# Patient Record
Sex: Female | Born: 1970 | Race: Asian | Hispanic: No | Marital: Married | Smoking: Never smoker
Health system: Southern US, Community
[De-identification: ages and names within clinical notes are randomized; demographics above are authoritative.]

---

## 2010-02-01 ENCOUNTER — Encounter: Admission: RE | Admit: 2010-02-01 | Discharge: 2010-02-01 | Payer: Self-pay | Admitting: Family Medicine

## 2010-02-01 ENCOUNTER — Encounter (INDEPENDENT_AMBULATORY_CARE_PROVIDER_SITE_OTHER): Payer: Self-pay | Admitting: *Deleted

## 2010-03-06 ENCOUNTER — Ambulatory Visit: Payer: Self-pay | Admitting: Gastroenterology

## 2010-03-06 ENCOUNTER — Encounter (INDEPENDENT_AMBULATORY_CARE_PROVIDER_SITE_OTHER): Payer: Self-pay | Admitting: *Deleted

## 2010-03-06 ENCOUNTER — Ambulatory Visit (HOSPITAL_COMMUNITY): Admission: RE | Admit: 2010-03-06 | Discharge: 2010-03-06 | Payer: Self-pay | Admitting: Gastroenterology

## 2010-03-06 DIAGNOSIS — R1013 Epigastric pain: Secondary | ICD-10-CM

## 2010-03-06 DIAGNOSIS — Z862 Personal history of diseases of the blood and blood-forming organs and certain disorders involving the immune mechanism: Secondary | ICD-10-CM

## 2010-03-06 LAB — CONVERTED CEMR LAB
ALT: 25 units/L (ref 0–35)
AST: 30 units/L (ref 0–37)
Albumin: 4.2 g/dL (ref 3.5–5.2)
Alkaline Phosphatase: 42 units/L (ref 39–117)
BUN: 4 mg/dL — ABNORMAL LOW (ref 6–23)
Basophils Absolute: 0.1 10*3/uL (ref 0.0–0.1)
CO2: 29 meq/L (ref 19–32)
Eosinophils Absolute: 0.2 10*3/uL (ref 0.0–0.7)
Folate: >20 ng/mL
GFR calc non Af Amer: 98.57 mL/min (ref >60)
Hemoglobin: 11.1 g/dL — ABNORMAL LOW (ref 12.0–15.0)
Lymphs Abs: 1.4 10*3/uL (ref 0.7–4.0)
MCV: 94 fL (ref 78.0–100.0)
Monocytes Absolute: 0.4 10*3/uL (ref 0.1–1.0)
Monocytes Relative: 6 % (ref 3.0–12.0)
Neutro Abs: 4.3 10*3/uL (ref 1.4–7.7)
Neutrophils Relative %: 68 % (ref 43.0–77.0)
RBC: 3.47 M/uL — ABNORMAL LOW (ref 3.87–5.11)
Sodium: 139 meq/L (ref 135–145)
TSH: 2.01 microintl units/mL (ref 0.35–5.50)
Total Bilirubin: 0.8 mg/dL (ref 0.3–1.2)
Transferrin: 241 mg/dL (ref 212.0–360.0)

## 2010-03-07 ENCOUNTER — Ambulatory Visit: Payer: Self-pay | Admitting: Gastroenterology

## 2010-03-08 ENCOUNTER — Telehealth: Payer: Self-pay | Admitting: Gastroenterology

## 2010-03-09 ENCOUNTER — Telehealth: Payer: Self-pay | Admitting: Gastroenterology

## 2010-03-12 ENCOUNTER — Encounter: Payer: Self-pay | Admitting: Gastroenterology

## 2010-03-12 ENCOUNTER — Telehealth: Payer: Self-pay | Admitting: Gastroenterology

## 2010-03-14 ENCOUNTER — Encounter: Payer: Self-pay | Admitting: Gastroenterology

## 2010-03-19 ENCOUNTER — Telehealth: Payer: Self-pay | Admitting: Gastroenterology

## 2010-04-03 ENCOUNTER — Ambulatory Visit: Payer: Self-pay | Admitting: Gastroenterology

## 2010-04-03 DIAGNOSIS — G43701 Chronic migraine without aura, not intractable, with status migrainosus: Secondary | ICD-10-CM | POA: Insufficient documentation

## 2010-07-19 ENCOUNTER — Encounter: Admission: RE | Admit: 2010-07-19 | Discharge: 2010-07-19 | Payer: Self-pay | Admitting: Specialist

## 2010-10-04 IMAGING — US US ABDOMEN COMPLETE
1 series · 14 of 25 positions shown · non-contrast
Comparison: None.

CLINICAL DATA: Epigastric abdominal pain.

ABDOMINAL ULTRASOUND COMPLETE

[Series 1: us abdomen complete · 0.15mm/px · 14 of 83 slices shown]
[im 1/83]
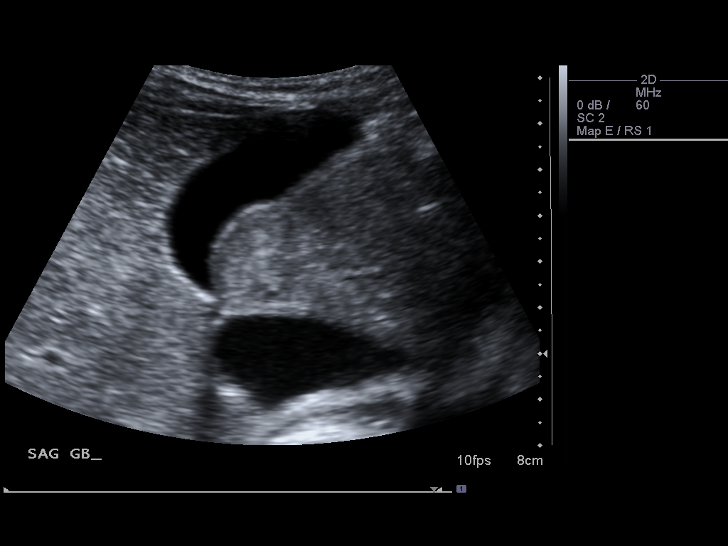
[im 7/83]
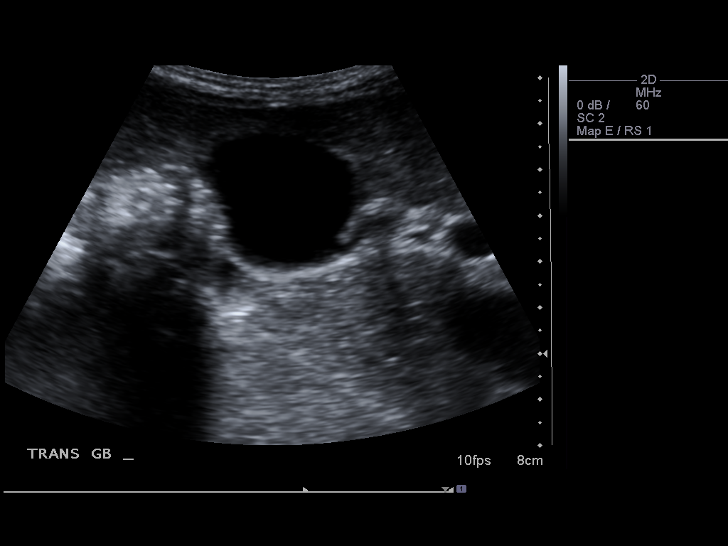
[im 14/83]
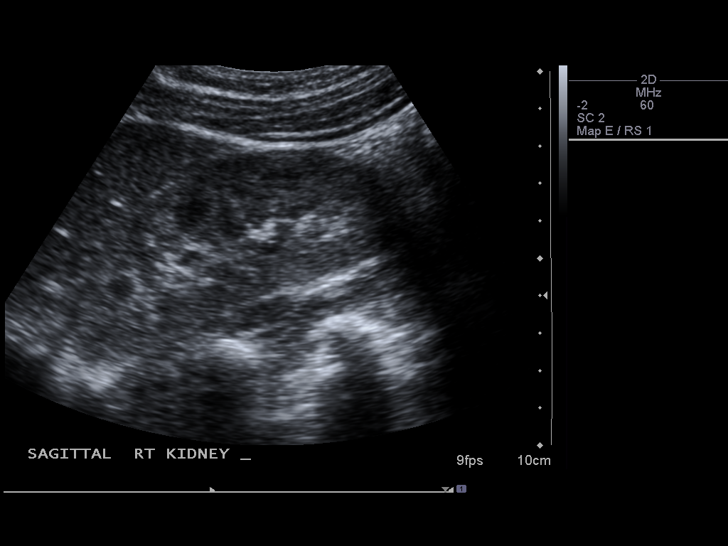
[im 21/83]
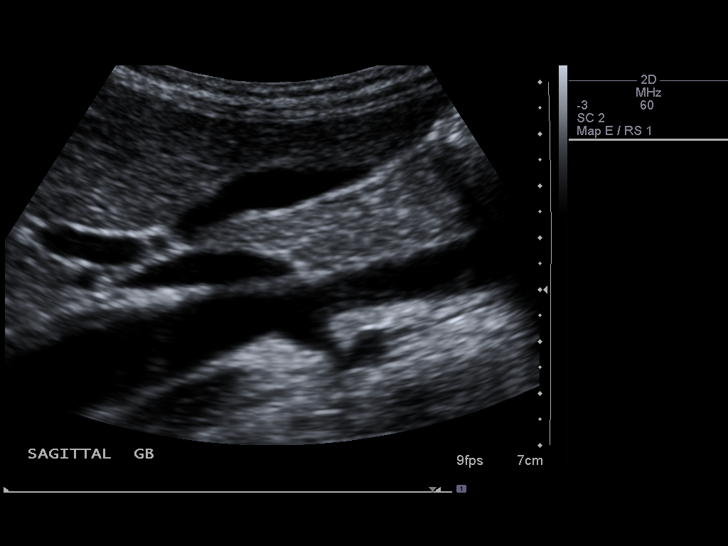
[im 28/83]
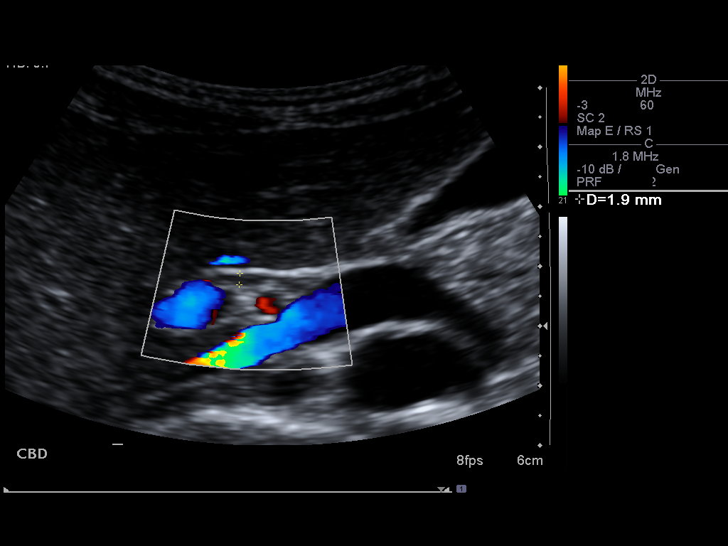
[im 31/83]
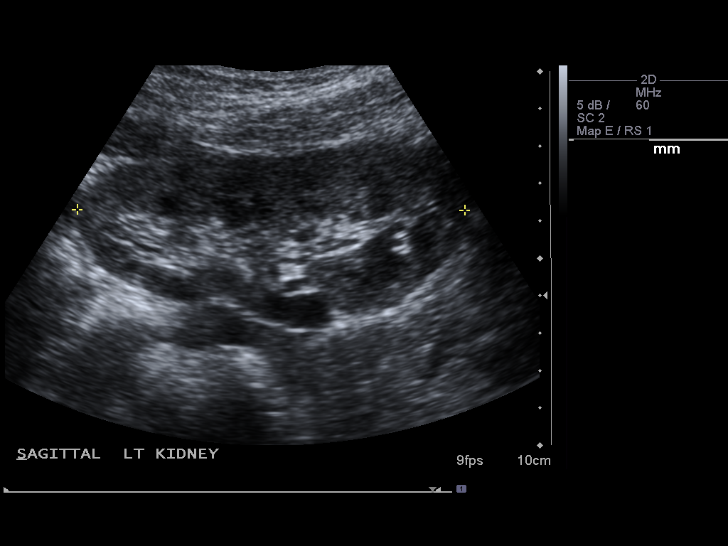
[im 38/83]
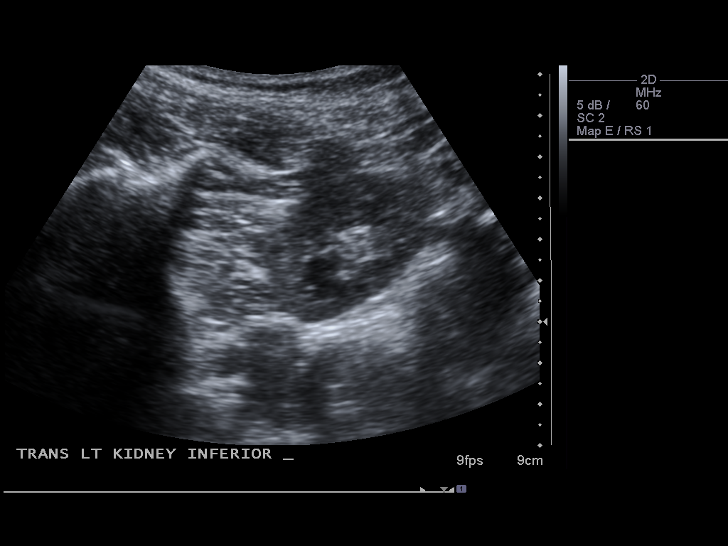
[im 45/83]
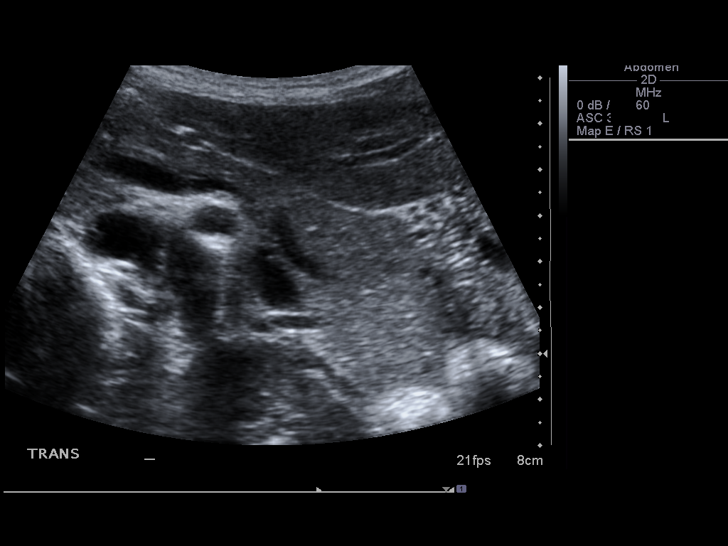
[im 52/83]
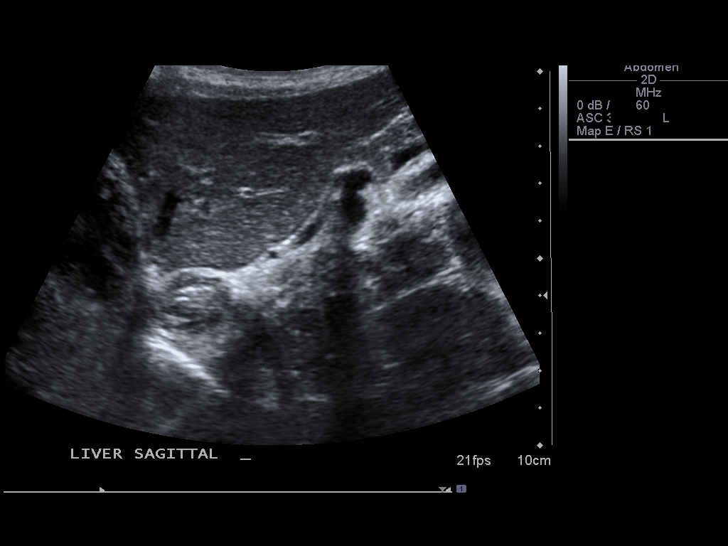
[im 55/83]
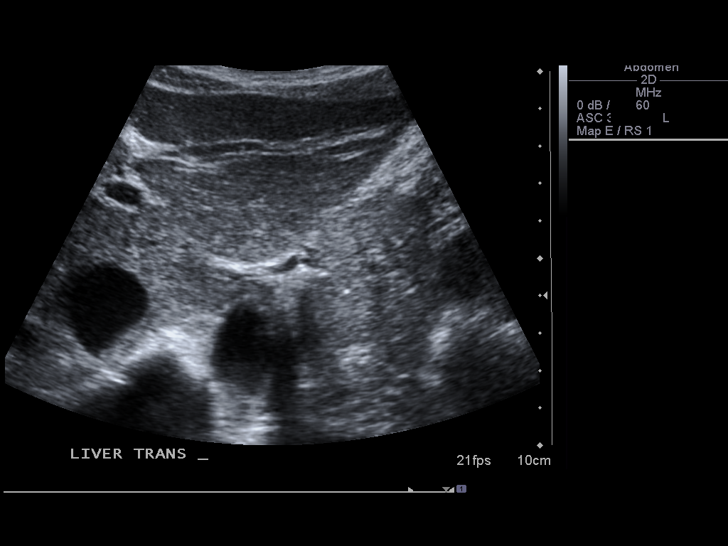
[im 62/83]
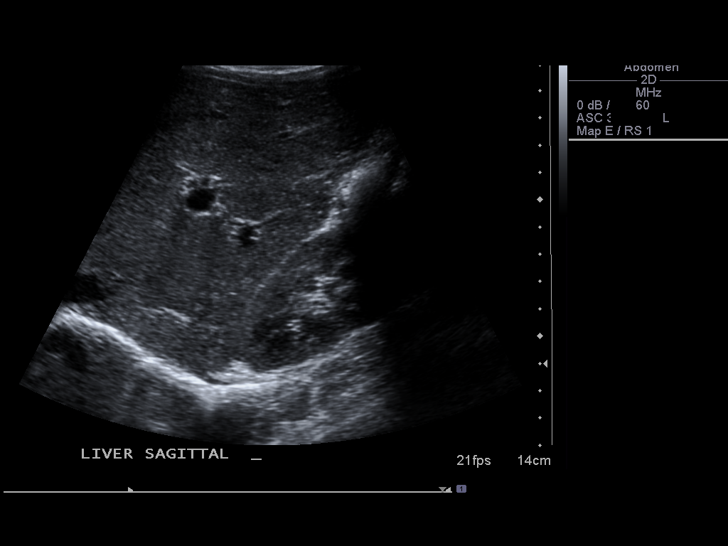
[im 69/83]
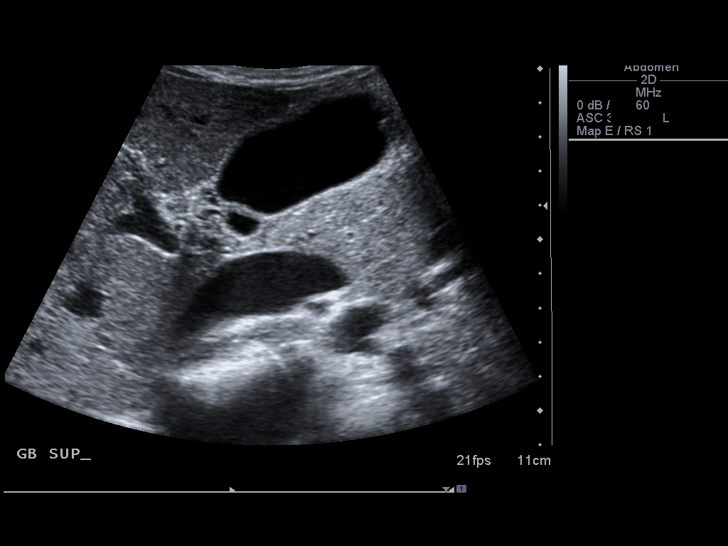
[im 76/83]
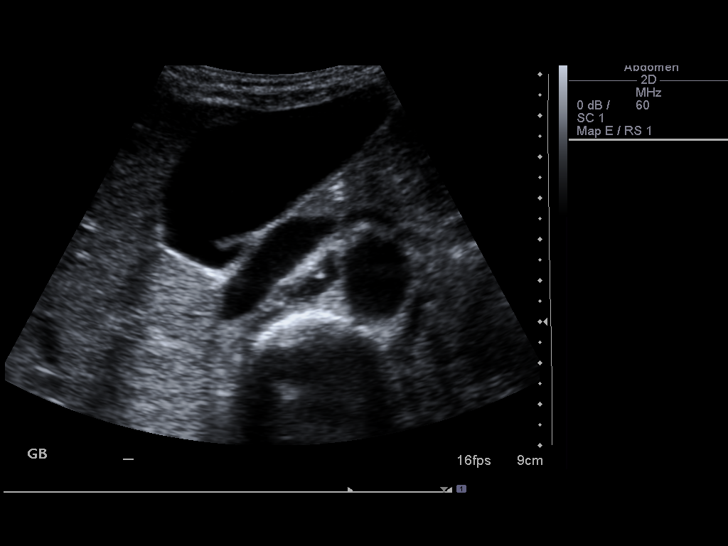
[im 83/83]
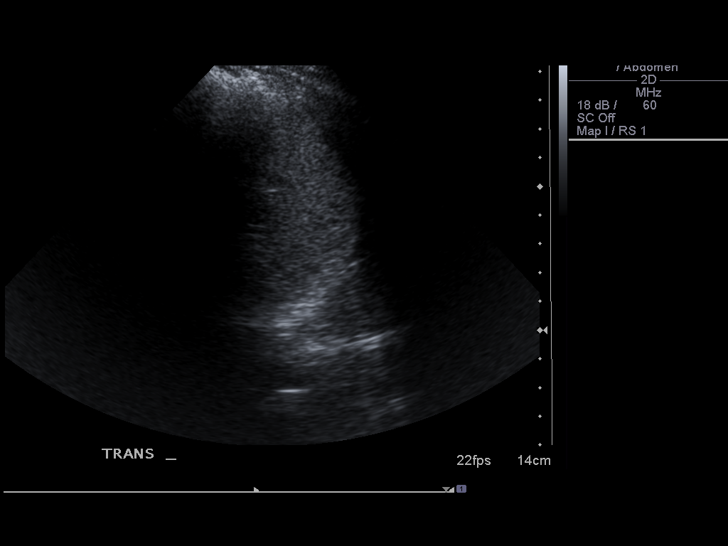

[14 of 25 positions shown; findings below may reference images not displayed]

FINDINGS: Gallbladder:  No gallstones, gallbladder wall thickening, or
pericholecystic fluid.

Common Bile Duct:  Within normal limits in caliber. Measures 4 mm
in diameter.

Liver:  No focal mass lesion identified.  Within normal limits in
parenchymal echogenicity.

IVC:  Appears normal.

Pancreas:  No abnormality identified, although entire pancreas
cannot be visualized by ultrasound.

Spleen:  Within normal limits in size and echotexture.

Right kidney:  Normal in size and parenchymal echogenicity.  No
evidence of mass or hydronephrosis.

Left kidney:  Normal in size and parenchymal echogenicity.  No
evidence of mass or hydronephrosis.

Abdominal Aorta:  No aneurysm identified.
IMPRESSION: Negative abdominal ultrasound.

## 2010-12-11 NOTE — Progress Notes (Signed)
Summary: Medication  Phone Note Call from Patient Call back at Home Phone [phone] Call back at [phone]   Caller: Patient Call For: Dr. Jarold Motto Summary of Call: Ins. will not cover Aciphex...needs an alternative. [facility] Initial call taken by: Karna Christmas,  Mar 19, 2010 12:02 PM  Follow-up for Phone Call        Pt was offered samples of aciphex to last until OV but pt states the aciphex caused constipation and she wants to try another med.  Would like to try prilosec because it has the best insurance coverage.   Ok to change?  20 or 40 mg? Follow-up by: Ashok Cordia RN,  Mar 19, 2010 12:30 PM  Additional Follow-up for Phone Call Additional follow up Details #1::        20 MG IS OK Additional Follow-up by: Mardella Layman MD [facility],  Mar 19, 2010 2:43 PM    New/Updated Medications: PRILOSEC 20 MG  CPDR (OMEPRAZOLE) 1 each day 30 minutes before meal Prescriptions: PRILOSEC 20 MG  CPDR (OMEPRAZOLE) 1 each day 30 minutes before meal  #30 x 6   Entered by:   Ashok Cordia RN   Authorized by:   Mardella Layman MD [facility]   Signed by:   Ashok Cordia RN on 03/19/2010   Method used:   Electronically to        [facility]  [phone]* (retail)       [address]       [place], [place]  [address]       Ph: [phone] or [phone]       Fax: [phone]   RxID:   [id number]

## 2010-12-11 NOTE — Assessment & Plan Note (Signed)
Summary: Post proc, 1 mo/dfs   History of Present Illness Visit Type: Follow-up Visit Primary GI MD: Sheryn Bison MD FACP FAGA Primary Provider: Marye Round, MD  Requesting Provider: na Chief Complaint: F/u from endo. Pt states that she feels great and denies any GI complaints History of Present Illness:   This patient is currently on Prilosec 20 mg a day for NSAID-induced gastric ulcerations confirmed at the time of previous endoscopy. Evaluation for Helicobacter were negative. Also evaluation for celiac disease was negative.  She was using frequent NSAIDs because of migraine headaches, these were discontinued, and she has been given a prescription for p.r.n. tramadol. She has not had neurologic evaluation, but denies other neurological symptoms at this time.   GI Review of Systems      Denies abdominal pain, acid reflux, belching, bloating, chest pain, dysphagia with liquids, dysphagia with solids, heartburn, loss of appetite, nausea, vomiting, vomiting blood, weight loss, and  weight gain.        Denies anal fissure, black tarry stools, change in bowel habit, constipation, diarrhea, diverticulosis, fecal incontinence, heme positive stool, hemorrhoids, irritable bowel syndrome, jaundice, light color stool, liver problems, rectal bleeding, and  rectal pain.    Current Medications (verified): 1)  Yasmin 28 3-0.03 Mg Tabs (Drospirenone-Ethinyl Estradiol) .... Daily 2)  Miralax  Powd (Polyethylene Glycol 3350) .... As Needed 3)  Prilosec 20 Mg  Cpdr (Omeprazole) .Marland Kitchen.. 1 Each Day 30 Minutes Before Meal 4)  Tramadol Hcl 50 Mg Tabs (Tramadol Hcl) .... Take 1 Tablet Every 6-8 Hrs As Needed For Pain  Allergies (verified): 1)  ! * Peanuts  Past History:  Past medical, surgical, family and social histories (including risk factors) reviewed for relevance to current acute and chronic problems.  Past Medical History: ANEMIA, MILD, HX OF (ICD-V12.3) during pregnancy  Past Surgical  History: Reviewed history from 03/06/2010 and no changes required. C-Section  Family History: Reviewed history from 03/06/2010 and no changes required. Family History of Diabetes:  Family History of Heart Disease:  No FH of Colon Cancer:  Social History: Reviewed history from 03/06/2010 and no changes required. Married 3 children Occupation: homemaker Patient is a former smoker.  Alcohol Use - no Illicit Drug Use - yes  3 per day Illicit Drug Use - no  Review of Systems  The patient denies allergy/sinus, anemia, anxiety-new, arthritis/joint pain, back pain, blood in urine, breast changes/lumps, change in vision, confusion, cough, coughing up blood, depression-new, fainting, fatigue, fever, headaches-new, hearing problems, heart murmur, heart rhythm changes, itching, menstrual pain, muscle pains/cramps, night sweats, nosebleeds, pregnancy symptoms, shortness of breath, skin rash, sleeping problems, sore throat, swelling of feet/legs, swollen lymph glands, thirst - excessive , urination - excessive , urination changes/pain, urine leakage, vision changes, and voice change.    Vital Signs:  Patient profile:   39 year old female Height:      63.5 inches Weight:      107 pounds BMI:     18.72 BSA:     1.49 Pulse rate:   68 / minute Pulse rhythm:   regular BP sitting:   100 / 64  (left arm) Cuff size:   regular  Vitals Entered By: Ok Anis CMA (Apr 03, 2010 9:47 AM)  Physical Exam  General:  Well developed, well nourished, no acute distress.healthy appearing.   Psych:  Alert and cooperative. Normal mood and affect.   Impression & Recommendations:  Problem # 1:  EPIGASTRIC PAIN (ICD-789.06) Assessment Improved Continue Prilosec daily for  another month and may discontinue if asymptomatic. She been advised to continue to avoid NSAIDs. Appointment has been made for her with Dr. Meryl Crutch at the headache clinic.  Problem # 2:  CHRONIC MIGRAINE W/O AURA W/O INTRACTABLE W/SM  (ICD-346.72)  Patient Instructions: 1)  Continue Prilosec for one month then you may stop it. 2)  You are being referred to a headache clinic. 3)  The medication list was reviewed and reconciled.  All changed / newly prescribed medications were explained.  A complete medication list was provided to the patient / caregiver. 4)  Copy sent to : Dr. Rudene Christians [facility] the [facility] and Dr. Aleatha Borer primary care. 5)  Avoid all NSAIDs and salicylates with p.r.n. tramadol use pending neurological evaluation and protocol for chronic headaches.  Appended Document: Post proc, 1 mo/dfs [facility].  Pt notified of appt.  04/16/10 at 8:00.  Dr. Catalina Lunger   Clinical Lists Changes  Orders: Added new Referral order of Headache Clinic Referral (Headache) - Signed

## 2010-12-11 NOTE — Miscellaneous (Signed)
Summary: Aciphex Samples   Clinical Lists Changes  Medications: Removed medication of ACIPHEX 20 MG  TBEC (RABEPRAZOLE SODIUM) Take 1 twice a day 30 minutes before meals Changed medication from ACIPHEX 20 MG TBEC (RABEPRAZOLE SODIUM) take 1 tablet daily 30 min before breakfast to ACIPHEX 20 MG TBEC (RABEPRAZOLE SODIUM) take 1 tablet daily 30 min before breakfast

## 2010-12-11 NOTE — Letter (Signed)
Summary: EGD Instructions  [facility]  [address] [place], [place] [address]   Phone: [phone]  Fax: [phone]       TYISHIA AUNE    [birth year]    MRN: [id number]       Procedure Day Dorna Bloom: Wednesday, 03/07/10     Arrival Time: 10:30     Procedure Time: 11:30     Location of Procedure:                    _X  _ [facility] (4th Floor)     PREPARATION FOR ENDOSCOPY   On 03/07/10 THE DAY OF THE PROCEDURE:  1.   No solid foods, milk or milk products are allowed after midnight the night before your procedure.  2.   Do not drink anything colored red or purple.  Avoid juices with pulp.  No orange juice.  3.  You may drink clear liquids until 9:30 , which is 2 hours before your procedure.                                                                                                CLEAR LIQUIDS INCLUDE: Water Jello Ice Popsicles Tea (sugar ok, no milk/cream) Powdered fruit flavored drinks Coffee (sugar ok, no milk/cream) Gatorade Juice: apple, white grape, white cranberry  Lemonade Clear bullion, consomm, broth Carbonated beverages (any kind) Strained chicken noodle soup Hard Candy   MEDICATION INSTRUCTIONS  Unless otherwise instructed, you should take regular prescription medications with a small sip of water as early as possible the morning of your procedure.  Diabetic patients - see separate instructions.                    OTHER INSTRUCTIONS  You will need a responsible adult at least 41 years of age to accompany you and drive you home.   This person must remain in the waiting room during your procedure.  Wear loose fitting clothing that is easily removed.  Leave jewelry and other valuables at home.  However, you may wish to bring a book to read or an iPod/MP3 player to listen to music as you wait for your procedure to start.  Remove all body piercing jewelry and leave at home.  Total time from sign-in  until discharge is approximately 2-3 hours.  You should go home directly after your procedure and rest.  You can resume normal activities the day after your procedure.  The day of your procedure you should not:   Drive   Make legal decisions   Operate machinery   Drink alcohol   Return to work  You will receive specific instructions about eating, activities and medications before you leave.    The above instructions have been reviewed and explained to me by   _______________________    I fully understand and can verbalize these instructions _____________________________ Date _________

## 2010-12-11 NOTE — Progress Notes (Signed)
  Phone Note Outgoing Call   Call placed by: Ok Anis CMA,  Mar 12, 2010 9:03 AM Call placed to: Insurer Summary of Call: Sent prior authorization form to [employer] [place]....Waiting on approval  Initial call taken by: Ok Anis CMA,  Mar 12, 2010 9:05 AM     Appended Document:  I called patient to explain that the insurance company [employer] will not approve Aciphex until she tries Prilosec, Protonix, Prevacid, or Nexium.  I advised patient that she has an office visit with Dr. Jarold Motto on May 24 at 9:45 am and per Lupita Leash S.-RN to keep this appointment and talk to Dr. Jarold Motto about the PPI because of her recent diagnosis. I will put more Aciphex samples up front to last patient until her appointment to talk to Dr. Jarold Motto.

## 2010-12-11 NOTE — Miscellaneous (Signed)
Summary: clotest  Clinical Lists Changes  Orders: Added new Test order of TLB-H Pylori Screen Gastric Biopsy (83013-CLOTEST) - Signed 

## 2010-12-11 NOTE — Procedures (Signed)
Summary: Upper Endoscopy  Patient: Meghan Matthews Note: All result statuses are Final unless otherwise noted.  Tests: (1) Upper Endoscopy (EGD)   EGD Upper Endoscopy       DONE     [facility]     [address]     [place], [place]  [address]           ENDOSCOPY PROCEDURE REPORT           PATIENT:  Meghan, Matthews  MR#:  [id number]     BIRTHDATE:  01/11/1971, 38 yrs. old  GENDER:  female           ENDOSCOPIST:  Vania Rea. Jarold Motto, MD, [facility]     Referred by:           PROCEDURE DATE:  03/07/2010     PROCEDURE:  EGD with biopsy     ASA CLASS:  Class I     INDICATIONS:  abdominal pain           MEDICATIONS:   Fentanyl 50 mcg IV, Versed 5 mg IV     TOPICAL ANESTHETIC:  Exactacain Spray           DESCRIPTION OF PROCEDURE:   After the risks benefits and     alternatives of the procedure were thoroughly explained, informed     consent was obtained.  The LB [id number] endoscope was     introduced through the mouth and advanced to the second portion of     the duodenum, without limitations.  The instrument was slowly     withdrawn as the mucosa was fully examined.     <<PROCEDUREIMAGES>>           Multiple ulcers were found in the antrum. 3MM-8MM,,,ULCERS.SHALLOW     WITH MILD GASTRITIS AND DUODENITIS.CLO AND REGULAR BIOPSIES DONE.     Normal duodenal folds were noted. SI BX. DONE  The esophagus and     gastroesophageal junction were completely normal in appearance.     "INLET POUCH" OF HETEROTROPHIC GASTRIC MUCOSA IN PROXIMAL     ESOPHAGUS NOTED.    Retroflexed views revealed no abnormalities.     The scope was then withdrawn from the patient and the procedure     completed.           COMPLICATIONS:  None           ENDOSCOPIC IMPRESSION:     1) Ulcers, multiple in the antrum     2) Normal duodenal folds     3) Normal esophagus     1.PROBABLE NSAID DAMAGE AND ULCERATIONS.R/O H.PYLORI.     2.R/O CELIAC DISEASE     RECOMMENDATIONS:     1) Await biopsy  results     2) Avoid NSAIDS for two weeks     1.ACIPHEX 20 MG /DAILY.330.SEE ME 1 MONTH.     2.CONTINUE PRN MIRALAX     3PRN TRAMADOL 50MG . Q6-8 PRN FOR PAIN.#65,REFILL X 2.           REPEAT EXAM:  No           ______________________________     Vania Rea. Jarold Motto, MD, [facility]           CC:           n.     eSIGNED:   Vania Rea. Preston Garabedian at 03/07/2010 12:08 PM           [employer], [phone]  Note: An exclamation mark Marland Kitchen)  indicates a result that was not dispersed into the flowsheet. Document Creation Date: 03/07/2010 12:09 PM _______________________________________________________________________  (1) Order result status: Final Collection or observation date-time: 03/07/2010 11:57 Requested date-time:  Receipt date-time:  Reported date-time:  Referring Physician:   Ordering Physician: Sheryn Bison [phone]) Specimen Source:  Source: Launa Grill Order Number: [id number] Lab site:

## 2010-12-11 NOTE — Miscellaneous (Signed)
Summary: Aciphex/Tramadol Rx  Clinical Lists Changes  Medications: Added new medication of ACIPHEX 20 MG TBEC (RABEPRAZOLE SODIUM) take 1 tablet daily 30 min before breakfast - Signed Added new medication of TRAMADOL HCL 50 MG TABS (TRAMADOL HCL) Take 1 tablet every 6-8 hrs as needed for pain - Signed Rx of ACIPHEX 20 MG TBEC (RABEPRAZOLE SODIUM) take 1 tablet daily 30 min before breakfast;  #30 x 0;  Signed;  Entered by: Jennye Boroughs RN;  Authorized by: Mardella Layman MD [facility];  Method used: Electronically to [facility]  [phone]*, [address], [place], [place], [place]  [address], Ph: [phone] or [phone], Fax: [phone] Rx of TRAMADOL HCL 50 MG TABS (TRAMADOL HCL) Take 1 tablet every 6-8 hrs as needed for pain;  #65 x 2;  Signed;  Entered by: Jennye Boroughs RN;  Authorized by: Mardella Layman MD [facility];  Method used: Electronically to [facility]  [phone]*, [address], [place], [place], [place]  [address], Ph: [phone] or [phone], Fax: [phone]    Prescriptions: TRAMADOL HCL 50 MG TABS (TRAMADOL HCL) Take 1 tablet every 6-8 hrs as needed for pain  #65 x 2   Entered by:   Jennye Boroughs RN   Authorized by:   Mardella Layman MD [facility]   Signed by:   Jennye Boroughs RN on 03/07/2010   Method used:   Electronically to        [facility]  [phone]* (retail)       [address]       [place], [place]  [address]       Ph: [phone] or [phone]       Fax: [phone]   RxID:   [id number] ACIPHEX 20 MG TBEC (RABEPRAZOLE SODIUM) take 1 tablet daily 30 min before breakfast  #30 x 0   Entered by:   Jennye Boroughs RN   Authorized by:   Mardella Layman MD [facility]   Signed by:   Jennye Boroughs RN on 03/07/2010   Method used:   Electronically to        [facility]  [phone]* (retail)       [address]       [place], [place]   [address]       Ph: [phone] or [phone]       Fax: [phone]   RxID:   [id number]

## 2010-12-11 NOTE — Letter (Signed)
Summary: New Patient letter  [facility]  [address] [place], [place] [address]   Phone: [phone]  Fax: [phone]       02/01/2010 MRN: [id number]  [facility] [address] [place], [place]  [address]  Dear Ms. Marquis Lunch,  Welcome to the Gastroenterology Division at [facility].    You are scheduled to see Dr. Jarold Motto on 03/06/2010 at 8:30AM on the 3rd floor at [facility], [address]  We ask that you try to arrive at our office 15 minutes prior to your appointment time to allow for check-in.  We would like you to complete the enclosed self-administered evaluation form prior to your visit and bring it with you on the day of your appointment.  We will review it with you.  Also, please bring a complete list of all your medications or, if you prefer, bring the medication bottles and we will list them.  Please bring your insurance card so that we may make a copy of it.  If your insurance requires a referral to see a specialist, please bring your referral form from your primary care physician.  Co-payments are due at the time of your visit and may be paid by cash, check or credit card.     Your office visit will consist of a consult with your physician (includes a physical exam), any laboratory testing he/she may order, scheduling of any necessary diagnostic testing (e.g. x-ray, ultrasound, CT-scan), and scheduling of a procedure (e.g. Endoscopy, Colonoscopy) if required.  Please allow enough time on your schedule to allow for any/all of these possibilities.    If you cannot keep your appointment, please call [phone] to cancel or reschedule prior to your appointment date.  This allows [place] the opportunity to schedule an appointment for another patient in need of care.  If you do not cancel or reschedule by 5 p.m. the business day prior to your appointment date, you will be charged a $50.00 late cancellation/no-show fee.    Thank you for  choosing [facility] for your medical needs.  We appreciate the opportunity to care for you.  Please visit [place] at our website  to learn more about our practice.                     Sincerely,                                                             The Gastroenterology Division

## 2010-12-11 NOTE — Progress Notes (Signed)
Summary: Aciphex Prior-Authorization   Phone Note Outgoing Call   Call placed by: Ok Anis CMA,  March 08, 2010 12:16 PM Call placed to: Insurer Summary of Call: [employer] and spoke with [place]. Per [place] will fax over form to  start prior authorization for patients Aciphex. I filled out and faxed back to [employer] waiting on response. Initial call taken by: Ok Anis CMA,  March 08, 2010 12:18 PM

## 2010-12-11 NOTE — Assessment & Plan Note (Signed)
Summary: ESPIGASTRIC PAIN,CHORNIC CONSTIPATION...AS.   History of Present Illness Visit Type: Initial Consult Primary GI MD: Sheryn Bison MD FACP FAGA Primary Provider: Jeanett Schlein Requesting Provider: Jeanett Schlein Chief Complaint: Chronic constipation and Epigastric pain History of Present Illness:   40 year old [place] female referred by Dr. Kathrynn Running for evaluation of worsening constipation, abdominal gas and bloating, and epigastric abdominal pain over the last several months.  This patient been in excellent health without chronic medical problems except for the last several months that she's had epigastric discomfort responding to frequent small meals. She is awakening at night with abdominal pain relieved by Maalox. Because of her pain she's had diminished appetite, diminished p.o. intake, and associated constipation with gas and bloating despite taking p.r.n. MiraLax and probiotics. She is a strict VEGAN and uses no meet or dairy products. She denies rectal bleeding or melena. She has not had previous GI evaluation or barium studies. She does take occasional NSAIDs but denies NSAID abuse, use of alcohol or cigarettes.  She was recently seen at [facility] and has a mild hyponatremia but otherwise a normal CBC a metabolic profile. Thyroid function tests were normal. She's had no systemic complaints such as fever, chills, skin rashes, or arthritic problems. She denies specific food intolerances such as lactose, and does not use p.o. sorbitol or fructose. Family history noncontributory. She continued with severe abdominal gas and bloating. She is on daily birth control pills. Recent pregnancy test was negative.   GI Review of Systems    Reports abdominal pain, acid reflux, belching, chest pain, heartburn, loss of appetite, nausea, and  vomiting.      Denies bloating, dysphagia with liquids, dysphagia with solids, vomiting blood, weight loss, and  weight gain.     Reports change in bowel habits.     Denies anal fissure, black tarry stools, constipation, diarrhea, diverticulosis, fecal incontinence, heme positive stool, hemorrhoids, irritable bowel syndrome, jaundice, light color stool, liver problems, rectal bleeding, and  rectal pain. Preventive Screening-Counseling & Management  Alcohol-Tobacco     Smoking Status: quit      Drug Use:  no.      Current Medications (verified): 1)  Yasmin 28 3-0.03 Mg Tabs (Drospirenone-Ethinyl Estradiol) .... Daily 2)  Miralax  Powd (Polyethylene Glycol 3350) .... As Needed  Allergies (verified): 1)  ! * Peanuts  Past History:  Past medical, surgical, family and social histories (including risk factors) reviewed for relevance to current acute and chronic problems.  Past Medical History:  ANEMIA, MILD, HX OF (ICD-V12.3) during pregnancy  Past Surgical History: C-Section  Family History: Reviewed history and no changes required. Family History of Diabetes:  Family History of Heart Disease:   Social History: Reviewed history and no changes required. Married 3 children Occupation: homemaker Patient is a former smoker.  Alcohol Use - no Illicit Drug Use - yes  3 per day Illicit Drug Use - no Smoking Status:  quit Drug Use:  no  Review of Systems       The patient complains of allergy/sinus, back pain, cough, depression-new, fatigue, headaches-new, itching, menstrual pain, muscle pains/cramps, night sweats, nosebleeds, shortness of breath, skin rash, sleeping problems, sore throat, thirst - excessive, and urination - excessive.  The patient denies anemia, anxiety-new, arthritis/joint pain, blood in urine, breast changes/lumps, change in vision, confusion, coughing up blood, fainting, fever, hearing problems, heart murmur, heart rhythm changes, pregnancy symptoms, swelling of feet/legs, swollen lymph glands, thirst - excessive , urination - excessive , urination changes/pain,  urine leakage, vision  changes, and voice change.    Vital Signs:  Patient profile:   40 year old female Height:      63.5 inches Weight:      113 pounds BMI:     19.77 BSA:     1.53 Pulse rate:   72 / minute Pulse rhythm:   regular BP sitting:   92 / 60  (left arm)  Vitals Entered By: Merri Ray CMA Duncan Dull) (March 06, 2010 8:14 AM)  Physical Exam  General:  Well developed, well nourished, no acute distress.healthy appearing.   Head:  Normocephalic and atraumatic. Eyes:  PERRLA, no icterus.exam deferred to patient's ophthalmologist.   Mouth:  No deformity or lesions, dentition normal. Neck:  Supple; no masses or thyromegaly. Lungs:  Clear throughout to auscultation. Heart:  Regular rate and rhythm; no murmurs, rubs,  or bruits. Abdomen:  Slight abdominal distention with multiple palpable bowel loops by doughy consistency consistent with feces filled bowel loops. No definite masses or tenderness noted. Rectal:  Normal exam.There is no evidence of perirectal lesions, masses, tenderness, impaction, and stool is questionably guaiac positive. Msk:  Symmetrical with no gross deformities. Normal posture. Pulses:  Normal pulses noted. Extremities:  No clubbing, cyanosis, edema or deformities noted. Neurologic:  Alert and  oriented x4;  grossly normal neurologically. Skin:  Intact without significant lesions or rashes. Cervical Nodes:  No significant cervical adenopathy. Psych:  Alert and cooperative. Normal mood and affect.   Impression & Recommendations:  Problem # 1:  EPIGASTRIC PAIN (ICD-789.06) Assessment Unchanged Her symptomatology seems most consistent with peptic ulcer disease and perhaps H. pylori infection. Have scheduled endoscopy with appropriate biopsies and I placed her on AcipHex 20 mg twice a day. Ultrasound of the upper abdomen is also been ordered as have repeat screening labs. She does not have symptoms suggestive of chronic malabsorption. I suspect her severe constipation related to  her decreased p.o. intake but she may need colonoscopy exam. Orders: TLB-BMP (Basic Metabolic Panel-BMET) (80048-METABOL) TLB-CBC Platelet - w/Differential (85025-CBCD) TLB-Hepatic/Liver Function Pnl (80076-HEPATIC) TLB-TSH (Thyroid Stimulating Hormone) (84443-TSH) TLB-B12, Serum-Total ONLY (16109-U04) TLB-Ferritin (82728-FER) TLB-Folic Acid (Folate) (82746-FOL) TLB-IBC Pnl (Iron/FE;Transferrin) (83550-IBC) TLB-Amylase (82150-AMYL) TLB-H. Pylori Abs(Helicobacter Pylori) (86677-HELICO) TLB-Lipase (83690-LIPASE) TLB-Magnesium (Mg) (83735-MG) TLB-Sedimentation Rate (ESR) (85652-ESR) TLB-IgA (Immunoglobulin A) (82784-IGA) T-Sprue Panel (Celiac Disease Aby Eval) (83516x3/[address]) Ultrasound Abdomen (UAS) EGD (EGD)  Problem # 2:  ANEMIA, MILD, HX OF (ICD-V12.3) Assessment: Unchanged Anemia profile ordered. She is a strict VEGAN and may have associated metabolic abnormalities and electrolyte dysfunction.Celiac antibodies also have been ordered.  Patient Instructions: 1)  Please go to the basement for lab work. 2)  Begin Aciphex twice a day. 3)  You are scheduled for an ultrasound an an upper endoscopy. 4)  The medication list was reviewed and reconciled.  All changed / newly prescribed medications were explained.  A complete medication list was provided to the patient / caregiver. 5)  Copy sent to : Dr. Aleatha Borer 6)  Please continue current medications.  7)  Conscious Sedation brochure given.  8)  Upper Endoscopy brochure given.

## 2010-12-11 NOTE — Letter (Signed)
Summary: Patient [facility] Biopsy Results  [facility]  [address] [place], [place] [address]   Phone: [phone]  Fax: [phone]        Mar 14, 2010 MRN: [id number]    [facility] [address] [place], [place]  [address]    Dear Ms. Cummings,  I am pleased to inform you that the biopsies taken during your recent endoscopic examination did not show any evidence of cancer upon pathologic examination.  Additional information/recommendations:  __No further action is needed at this time.  Please follow-up with      your primary care physician for your other healthcare needs.  __ Please call [phone] to schedule a return visit to review      your condition.  _x_ Continue with the treatment plan as outlined on the day of your      exam.  __ You should have a repeat endoscopic examination for this problem              in _ months/years.   Please call us if you are having persistent problems or have questions about your condition that have not been fully answered at this time.  Sincerely,  Mardella Layman MD [facility]  This letter has been electronically signed by your physician.  Appended Document: Patient Notice-Endo Biopsy Results letter mailed 5.5.11

## 2010-12-11 NOTE — Progress Notes (Signed)
Summary: samples  Phone Note Call from Patient Call back at [phone]   Caller: husband Call For: Dr. Jarold Motto Reason for Call: Refill Medication Summary of Call: pt has run out of Aciphex while waiting for prior auth... would like more samples  Patient insurance is [employer]BS and the form that needs to be filled out for the prior auth is for [facility]  Initial call taken by: Vallarie Mare,  March 09, 2010 8:18 AM  Follow-up for Phone Call        Samples of aciphex left for pt.  Husband informed.  Follow up appt sch.    Follow-up by: Ashok Cordia RN,  March 09, 2010 11:33 AM  Additional Follow-up for Phone Call Additional follow up Details #1::        to [facility] to work on prior aurth Additional Follow-up by: Ashok Cordia RN,  March 09, 2010 11:35 AM    Additional Follow-up for Phone Call Additional follow up Details #2::    I spoke with [employer] on 4-28--note is in patients chart.. Sent prior authorization form on 4-28 waiting for approval. Will call today and check on claim. Follow-up by: Ok Anis CMA,  Mar 12, 2010 8:24 AM  New/Updated Medications: ACIPHEX 20 MG TBEC (RABEPRAZOLE SODIUM) take 1 tablet daily 30 min before breakfast

## 2012-11-08 ENCOUNTER — Ambulatory Visit: Payer: BC Managed Care – PPO

## 2012-11-08 ENCOUNTER — Ambulatory Visit (INDEPENDENT_AMBULATORY_CARE_PROVIDER_SITE_OTHER): Payer: BC Managed Care – PPO | Admitting: Family Medicine

## 2012-11-08 VITALS — BP 148/86 | HR 80 | Temp 98.7°F | Resp 16 | Ht 63.0 in | Wt 110.0 lb

## 2012-11-08 DIAGNOSIS — Z111 Encounter for screening for respiratory tuberculosis: Secondary | ICD-10-CM

## 2012-11-08 DIAGNOSIS — R7611 Nonspecific reaction to tuberculin skin test without active tuberculosis: Secondary | ICD-10-CM

## 2012-11-08 DIAGNOSIS — Z789 Other specified health status: Secondary | ICD-10-CM

## 2012-11-08 DIAGNOSIS — Z9229 Personal history of other drug therapy: Secondary | ICD-10-CM

## 2012-11-08 DIAGNOSIS — Z9289 Personal history of other medical treatment: Secondary | ICD-10-CM

## 2012-11-08 NOTE — Progress Notes (Signed)
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 She came in here for further assessment. She is from [place] and had BCG vaccine in the past. She has had PPDs and they have been positive..  Objective: PPD on left arm is positive 14 mm. Her chest is clear. Heart regular without murmurs.  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  Plan: Check single view chest x-ray  [facility] reading (PRIMARY) by  Dr. Alwyn Ren Normal cxr  Write her a letter justifying her PPD .

## 2012-11-08 NOTE — Progress Notes (Signed)
  Tuberculosis Risk Questionnaire  1. Were you born outside the [place] in one of the following parts of the world:    [place], [place], [place], [place] or [place]?  Yes   2. Have you traveled outside the [place] and lived for more than one month in one of the following parts of the world:  [place], [place], [place], [place] or [place]?  Yes   3. Do you have a compromised immune system such as from any of the following conditions:  HIV/AIDS, organ or bone marrow transplantation, diabetes, immunosuppressive   medicines (e.g. Prednisone, Remicaide), leukemia, lymphoma, cancer of the   head or neck, gastrectomy or jejunal bypass, end-stage renal disease (on   dialysis), or silicosis?  No    4. Have you ever done one of the following:    Used crack cocaine, injected illegal drugs, worked or resided in jail or prison,   worked or resided at a homeless shelter, or worked as a Research scientist (physical sciences) in   direct contact with patients?  No  5. Have you ever been exposed to anyone with infectious tuberculosis?  No   Tuberculosis Symptom Questionnaire  Do you currently have any of the following symptoms?  1. Unexplained cough lasting more than 3 weeks? No  Unexplained fever lasting more than 3 weeks. No   3. Night Sweats (sweating that leaves the bedclothes and sheets wet)   No  4. Shortness of Breath No  5. Chest Pain No  6. Unintentional weight loss  No  7. Unexplained fatigue (very tired for no reason) No

## 2012-11-08 NOTE — Patient Instructions (Signed)
Whenever you need a PPD, Calvin you cannot get it to to the test being positive from your previous BCG vaccination.

## 2013-06-08 IMAGING — CR DG CHEST 1V
1 series · 1 of 1 positions shown · non-contrast
Comparison: 07/19/2010.

CLINICAL DATA: Positive PPD test.

CHEST - 1 VIEW

[PA]
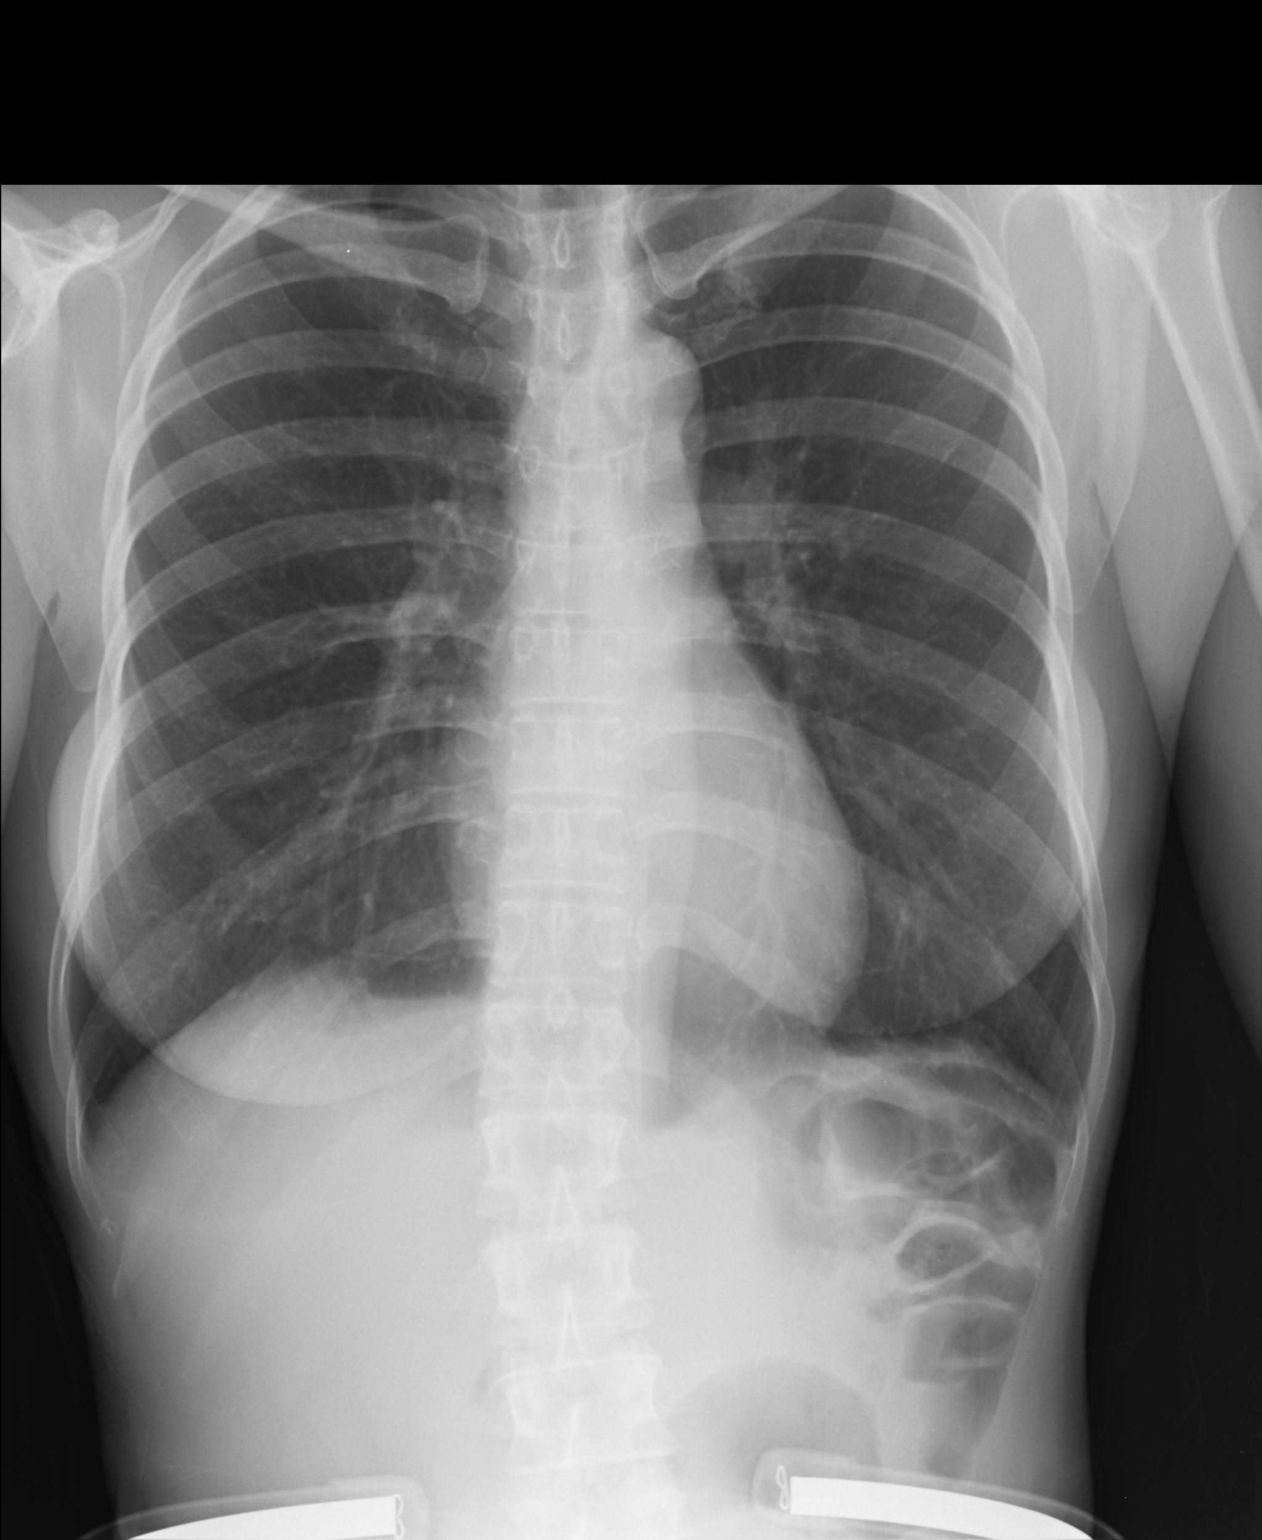

[1 of 1 positions shown; findings below may reference images not displayed]

FINDINGS: Trachea is midline.  Heart size normal.  Lungs are
somewhat hyperinflated but clear.  No pleural fluid.
IMPRESSION: No acute findings.  No evidence of active tuberculosis.

## 2014-02-12 ENCOUNTER — Ambulatory Visit: Payer: BC Managed Care – PPO

## 2014-02-12 ENCOUNTER — Ambulatory Visit (INDEPENDENT_AMBULATORY_CARE_PROVIDER_SITE_OTHER): Payer: BC Managed Care – PPO | Admitting: Physician Assistant

## 2014-02-12 VITALS — BP 102/62 | HR 81 | Temp 98.8°F | Resp 18 | Ht 65.75 in | Wt 110.6 lb

## 2014-02-12 DIAGNOSIS — Z9289 Personal history of other medical treatment: Secondary | ICD-10-CM

## 2014-02-12 DIAGNOSIS — Z0000 Encounter for general adult medical examination without abnormal findings: Secondary | ICD-10-CM

## 2014-02-12 DIAGNOSIS — Z1329 Encounter for screening for other suspected endocrine disorder: Secondary | ICD-10-CM

## 2014-02-12 DIAGNOSIS — Z23 Encounter for immunization: Secondary | ICD-10-CM

## 2014-02-12 DIAGNOSIS — Z13220 Encounter for screening for lipoid disorders: Secondary | ICD-10-CM

## 2014-02-12 DIAGNOSIS — Z789 Other specified health status: Secondary | ICD-10-CM

## 2014-02-12 DIAGNOSIS — Z1239 Encounter for other screening for malignant neoplasm of breast: Secondary | ICD-10-CM

## 2014-02-12 LAB — POCT URINALYSIS DIPSTICK
BILIRUBIN UA: NEGATIVE
GLUCOSE UA: NEGATIVE
Ketones, UA: NEGATIVE
Leukocytes, UA: NEGATIVE
Nitrite, UA: NEGATIVE
PROTEIN UA: NEGATIVE
RBC UA: NEGATIVE
Urobilinogen, UA: 0.2
pH, UA: 6

## 2014-02-12 LAB — COMPLETE METABOLIC PANEL WITH GFR
ALBUMIN: 4.5 g/dL (ref 3.5–5.2)
ALK PHOS: 60 U/L (ref 39–117)
ALT: 12 U/L (ref 0–35)
AST: 19 U/L (ref 0–37)
BUN: 13 mg/dL (ref 6–23)
CO2: 24 mEq/L (ref 19–32)
Calcium: 9.3 mg/dL (ref 8.4–10.5)
Chloride: 102 mEq/L (ref 96–112)
Creat: 0.76 mg/dL (ref 0.50–1.10)
GFR, Est African American: >89 mL/min
GFR, Est Non African American: >89 mL/min
Glucose, Bld: 75 mg/dL (ref 70–99)
Potassium: 3.5 mEq/L (ref 3.5–5.3)
Sodium: 136 mEq/L (ref 135–145)
TOTAL PROTEIN: 7.9 g/dL (ref 6.0–8.3)
Total Bilirubin: 0.5 mg/dL (ref 0.2–1.2)

## 2014-02-12 LAB — POCT CBC
Granulocyte percent: 67.4 %G (ref 37–80)
HEMATOCRIT: 40.4 % (ref 37.7–47.9)
HEMOGLOBIN: 12.8 g/dL (ref 12.2–16.2)
LYMPH, POC: 1.8 (ref 0.6–3.4)
MCH, POC: 30 pg (ref 27–31.2)
MCHC: 31.7 g/dL — AB (ref 31.8–35.4)
MCV: 94.8 fL (ref 80–97)
MID (cbc): 0.4 (ref 0–0.9)
MPV: 8.6 fL (ref 0–99.8)
POC Granulocyte: 4.5 (ref 2–6.9)
POC LYMPH %: 26.2 % (ref 10–50)
POC MID %: 6.4 %M (ref 0–12)
Platelet Count, POC: 225 10*3/uL (ref 142–424)
RBC: 4.26 M/uL (ref 4.04–5.48)
RDW, POC: 13.8 %
WBC: 6.7 10*3/uL (ref 4.6–10.2)

## 2014-02-12 LAB — LIPID PANEL
CHOL/HDL RATIO: 3 ratio
CHOLESTEROL: 139 mg/dL (ref 0–200)
HDL: 46 mg/dL (ref >39)
LDL CALC: 73 mg/dL (ref 0–99)
TRIGLYCERIDES: 98 mg/dL (ref <150)
VLDL: 20 mg/dL (ref 0–40)

## 2014-02-12 LAB — TSH: TSH: 1.136 u[IU]/mL (ref 0.350–4.500)

## 2014-02-12 NOTE — Progress Notes (Signed)
   Subjective:    Patient ID: Meghan Matthews, female    DOB: 11-Jul-1971, 43 y.o.   MRN: [id number]  HPI Here for CPE.  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  She is also waiting on [employer] [facility] [employer] and would like that form filled out as well. She is healthy and has been in the [place] since 2011.  She had her vaccination records from her immigration but she does not have the results of the titers and the school form wants the original copies of the titers so we will recheck at this time.  We repeated the CXR due to the school wanting a CXR within the year.    Saw eye doctor within the month - wears contacts and glasses Last pap 2 years ago - normal Mammogram - 3 years ago - normal  Review of Systems  Constitutional: Negative.   HENT: Negative.   Eyes: Negative.   Respiratory: Negative.   Cardiovascular: Negative.   Gastrointestinal: Negative.   Endocrine: Negative.   Genitourinary: Negative.   Musculoskeletal: Negative.   Skin: Negative.   Allergic/Immunologic: Negative.   Neurological: Negative.   Hematological: Negative.   Psychiatric/Behavioral: Negative.        Objective:   Physical Exam  Vitals reviewed. Constitutional: She is oriented to person, place, and time. She appears well-developed and well-nourished.  HENT:  Head: Normocephalic and atraumatic.  Right Ear: Hearing, tympanic membrane, external ear and ear canal normal.  Left Ear: Hearing, tympanic membrane, external ear and ear canal normal.  Nose: Nose normal.  Mouth/Throat: Uvula is midline, oropharynx is clear and moist and mucous membranes are normal.  Eyes: Conjunctivae and EOM are normal. Pupils are equal, round, and reactive to light.  Neck: Normal range of motion. Neck supple. No thyromegaly present.  Cardiovascular: Normal rate, regular rhythm and normal heart sounds.   No murmur heard. Pulmonary/Chest: Effort normal and breath sounds normal. She has no wheezes.  Abdominal: Soft.  Bowel sounds are normal.  Musculoskeletal: Normal range of motion.  Neurological: She is alert and oriented to person, place, and time. She has normal reflexes.  Skin: Skin is warm and dry.  Psychiatric: She has a normal mood and affect. Her behavior is normal. Judgment and thought content normal.   [facility] reading (PRIMARY) by  Dr. Cleta Albertsaub.  Mild tenting of the R hemidiaphragm     Assessment & Plan:  Annual physical exam - Plan: POCT CBC, COMPLETE METABOLIC PANEL WITH GFR, POCT urinalysis dipstick  Immune to varicella - Plan: Varicella zoster antibody, IgG  Immune to measles - Plan: Measles/Mumps/Rubella Immunity  Need for hepatitis B vaccination - Plan: Hepatitis B vaccine adult IM, Hepatitis B vaccine adult IM, Hepatitis B vaccine adult IM  Screening for breast cancer - Plan: MM Digital Screening  Screening for thyroid disorder - Plan: TSH  Screening for cholesterol level - Plan: Lipid panel  History of positive PPD - Plan: DG Chest 1 View  Form filled out - see media for copies -  RTC for Hep B series continuation.  Benny LennertSarah Weber PA-C  [facility] 02/12/2014 4:17 PM

## 2014-02-15 ENCOUNTER — Ambulatory Visit
Admission: RE | Admit: 2014-02-15 | Discharge: 2014-02-15 | Disposition: A | Discharge status: Home / Self Care | Payer: BC Managed Care – PPO | Source: Ambulatory Visit | Attending: Physician Assistant | Admitting: Physician Assistant

## 2014-02-15 DIAGNOSIS — Z1239 Encounter for other screening for malignant neoplasm of breast: Secondary | ICD-10-CM

## 2014-02-15 LAB — VARICELLA ZOSTER ANTIBODY, IGG: Varicella IgG: 361.2 Index — ABNORMAL HIGH (ref <135.00)

## 2014-02-15 LAB — MEASLES/MUMPS/RUBELLA IMMUNITY
RUBELLA: 3.58 {index} — AB (ref <0.90)
Rubeola IgG: >300 AU/mL — ABNORMAL HIGH (ref <25.00)

## 2014-02-17 ENCOUNTER — Encounter: Payer: Self-pay | Admitting: Physician Assistant

## 2014-02-17 NOTE — Telephone Encounter (Signed)
FYI: spoke with pt and left a copy up front for her to p/u

## 2014-02-17 NOTE — Telephone Encounter (Signed)
Will you please call the patient and let her know her mammo results are normal and I sent the results to her in a mychart message.

## 2014-03-14 ENCOUNTER — Ambulatory Visit (INDEPENDENT_AMBULATORY_CARE_PROVIDER_SITE_OTHER): Payer: BC Managed Care – PPO | Admitting: *Deleted

## 2014-03-14 DIAGNOSIS — Z23 Encounter for immunization: Secondary | ICD-10-CM

## 2014-09-12 IMAGING — CR DG CHEST 1V
1 series · 1 of 1 positions shown · non-contrast
Comparison: None.

CLINICAL DATA: screening for TB due to h/o + PPD in past

EXAM:
CHEST - 1 VIEW

[PA]
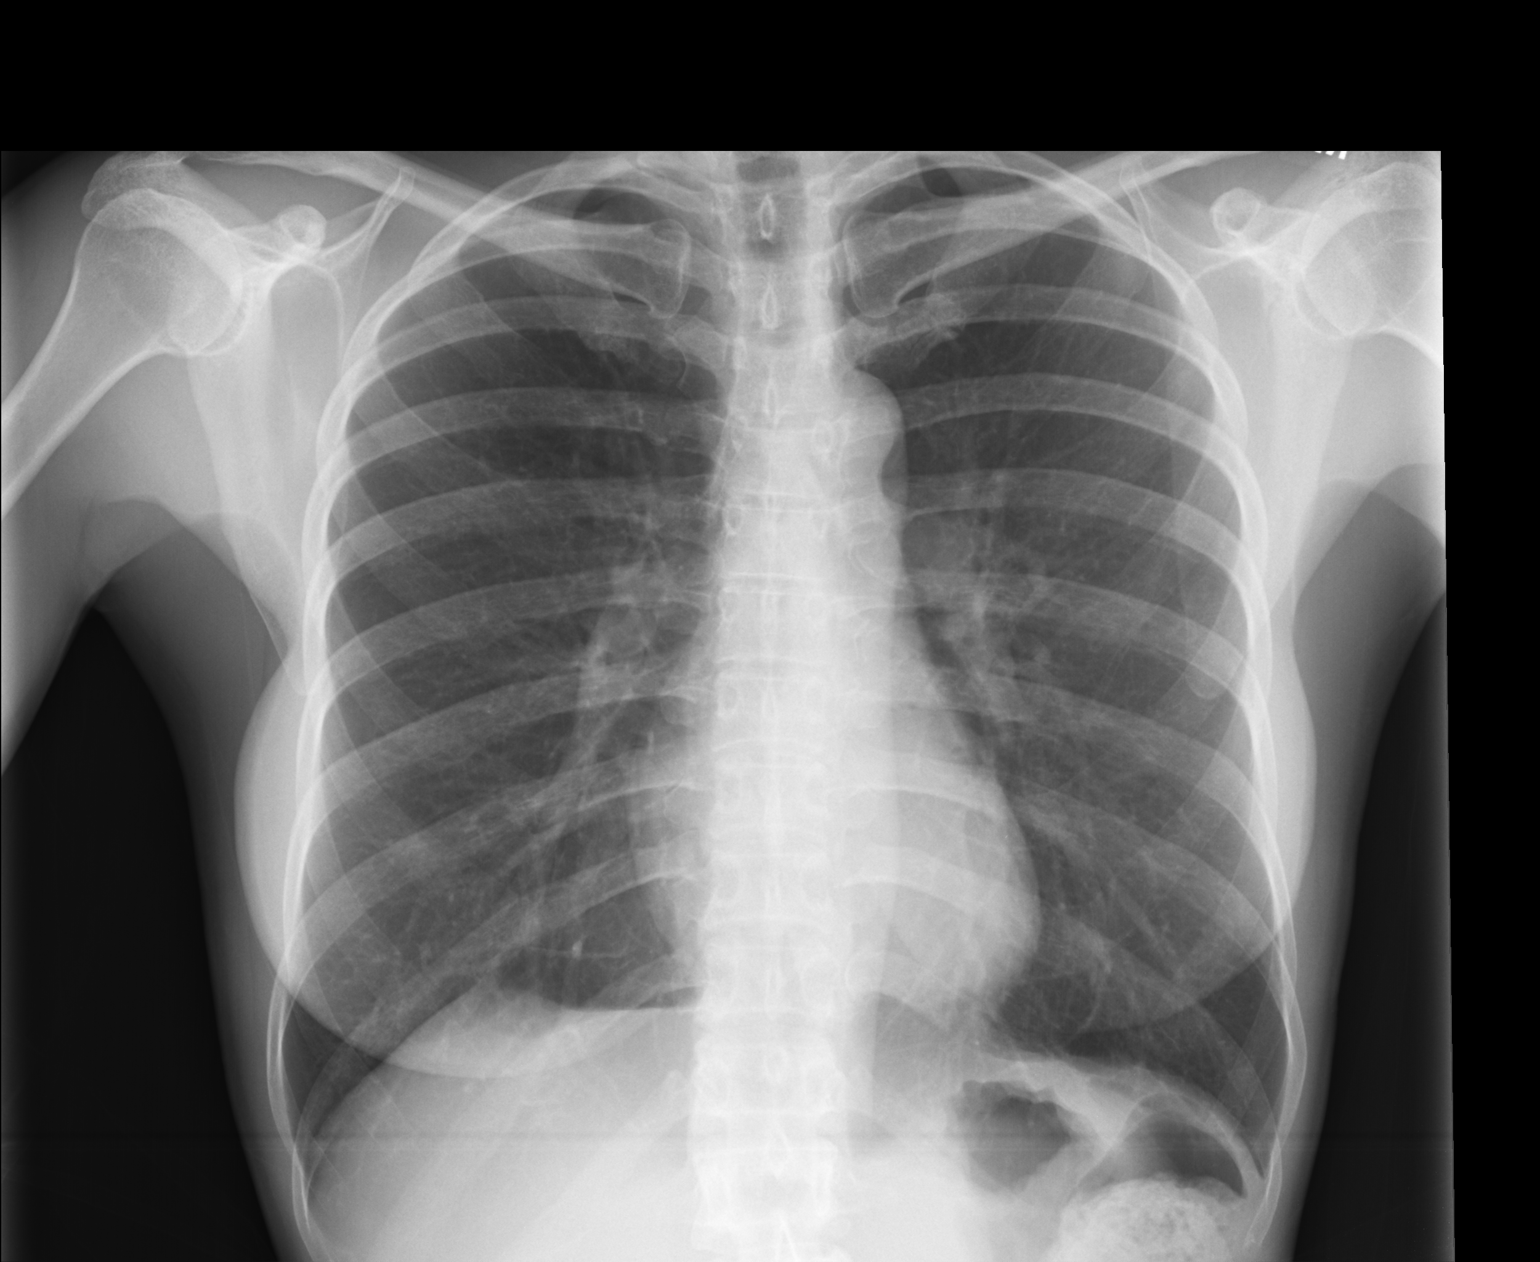

[1 of 1 positions shown; findings below may reference images not displayed]

FINDINGS: The heart size and mediastinal contours are within normal limits.
Both lungs are clear. The visualized skeletal structures are
unremarkable.
IMPRESSION: No active disease.

## 2015-08-10 ENCOUNTER — Ambulatory Visit (INDEPENDENT_AMBULATORY_CARE_PROVIDER_SITE_OTHER): Payer: 59 | Admitting: Physician Assistant

## 2015-08-10 VITALS — BP 100/80 | HR 85 | Temp 98.6°F | Resp 16 | Ht 65.0 in | Wt 119.0 lb

## 2015-08-10 DIAGNOSIS — Z23 Encounter for immunization: Secondary | ICD-10-CM | POA: Diagnosis not present

## 2015-08-10 DIAGNOSIS — Z1159 Encounter for screening for other viral diseases: Secondary | ICD-10-CM

## 2015-08-10 NOTE — Patient Instructions (Signed)
Td Vaccine (Tetanus and Diphtheria): What You Need to Know 1. Why get vaccinated? Tetanus  and diphtheria are very serious diseases. They are rare in the [place] today, but people who do become infected often have severe complications. Td vaccine is used to protect adolescents and adults from both of these diseases. Both tetanus and diphtheria are infections caused by bacteria. Diphtheria spreads from person to person through coughing or sneezing. Tetanus-causing bacteria enter the body through cuts, scratches, or wounds. TETANUS (Lockjaw) causes painful muscle tightening and stiffness, usually all over the body.  It can lead to tightening of muscles in the head and neck so you can't open your mouth, swallow, or sometimes even breathe. Tetanus kills about 1 out of every 5 people who are infected. DIPHTHERIA can cause a thick coating to form in the back of the throat.  It can lead to breathing problems, paralysis, heart failure, and death. Before vaccines, the [place] saw as many as 200,000 cases a year of diphtheria and hundreds of cases of tetanus. Since vaccination began, cases of both diseases have dropped by about 99%. 2. Td vaccine Td vaccine can protect adolescents and adults from tetanus and diphtheria. Td is usually given as a booster dose every 10 years but it can also be given earlier after a severe and dirty wound or burn. Your doctor can give you more information. Td may safely be given at the same time as other vaccines. 3. Some people should not get this vaccine  If you ever had a life-threatening allergic reaction after a dose of any tetanus or diphtheria containing vaccine, OR if you have a severe allergy to any part of this vaccine, you should not get Td. Tell your doctor if you have any severe allergies.  Talk to your doctor if you:  have epilepsy or another nervous system problem,  had severe pain or swelling after any vaccine containing diphtheria or  tetanus,  ever had Guillain Barr Syndrome (GBS),  aren't feeling well on the day the shot is scheduled. 4. Risks of a vaccine reaction With a vaccine, like any medicine, there is a chance of side effects. These are usually mild and go away on their own. Serious side effects are also possible, but are very rare. Most people who get Td vaccine do not have any problems with it. Mild Problems  following Td (Did not interfere with activities)  Pain where the shot was given (about 8 people in 10)  Redness or swelling where the shot was given (about 1 person in 3)  Mild fever (about 1 person in 15)  Headache or Tiredness (uncommon) Moderate Problems following Td (Interfered with activities, but did not require medical attention)  Fever over 102F (rare) Severe Problems  following Td (Unable to perform usual activities; required medical attention)  Swelling, severe pain, bleeding and/or redness in the arm where the shot was given (rare). Problems that could happen after any vaccine:  Brief fainting spells can happen after any medical procedure, including vaccination. Sitting or lying down for about 15 minutes can help prevent fainting, and injuries caused by a fall. Tell your doctor if you feel dizzy, or have vision changes or ringing in the ears.  Severe shoulder pain and reduced range of motion in the arm where a shot was given can happen, very rarely, after a vaccination.  Severe allergic reactions from a vaccine are very rare, estimated at less than 1 in a million doses. If one were to occur,   it would usually be within a few minutes to a few hours after the vaccination. 5. What if there is a serious reaction? What should I look for?  Look for anything that concerns you, such as signs of a severe allergic reaction, very high fever, or behavior changes. Signs of a severe allergic reaction can include hives, swelling of the face and throat, difficulty breathing, a fast heartbeat,  dizziness, and weakness. These would usually start a few minutes to a few hours after the vaccination. What should I do?  If you think it is a severe allergic reaction or other emergency that can't wait, call 9-1-1 or get the person to the nearest hospital. Otherwise, call your doctor.  Afterward, the reaction should be reported to the Vaccine Adverse Event Reporting System (VAERS). Your doctor might file this report, or you can do it yourself through the VAERS web site at www.vaers.hhs.gov, or by calling 1-800-822-7967. VAERS is only for reporting reactions. They do not give medical advice. 6. The National Vaccine Injury Compensation Program The National Vaccine Injury Compensation Program (VICP) is a federal program that was created to compensate people who may have been injured by certain vaccines. Persons who believe they may have been injured by a vaccine can learn about the program and about filing a claim by calling 1-800-338-2382 or visiting the VICP website at www.hrsa.gov/vaccinecompensation. 7. How can I learn more?  Ask your doctor.  Contact your local or state health department.  Contact the Centers for Disease Control and Prevention (CDC):  Call 1-800-232-4636 (1-800-CDC-INFO)  Visit CDC's website at www.cdc.gov/vaccines CDC Td Vaccine Interim VIS (12/15/12) Document Released: 08/25/2006 Document Revised: 03/14/2014 Document Reviewed: 02/09/2014 ExitCare Patient Information 2015 [employer]. This information is not intended to replace advice given to you by your health care provider. Make sure you discuss any questions you have with your health care provider.  

## 2015-08-10 NOTE — Progress Notes (Signed)
   Meghan Matthews  MRN: [id number] DOB: 10-Jan-1971  Subjective:  Pt presents to clinic for immunizations for her to be able to stay a student at [employer].  She is studying sports exercise.  She has her vaccine record from immigration but they will not accept it at school.  She needs a MMR titer and Td to stay in school.  Patient Active Problem List   Diagnosis Date Noted  . Immune to varicella 02/12/2014  . Immune to measles 02/12/2014  . 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 EPIGASTRIC PAIN 03/06/2010  . ANEMIA, MILD, HX OF 03/06/2010    No current outpatient prescriptions on file prior to visit.   No current facility-administered medications on file prior to visit.    Allergies  Allergen Reactions  . Peanut-Containing Drug Products     Review of Systems Objective:  BP 100/80 mmHg  Pulse 85  Temp(Src) 98.6 F (37 C) (Oral)  Resp 16  Ht $R'5\' 5"'VZ$  (1.651 m)  Wt 119 lb (53.978 kg)  BMI 19.80 kg/m2  SpO2 98%  LMP 07/31/2015 (Approximate)  Physical Exam  Constitutional: She is oriented to person, place, and time and well-developed, well-nourished, and in no distress.  HENT:  Head: Normocephalic and atraumatic.  Right Ear: Hearing and external ear normal.  Left Ear: Hearing and external ear normal.  Eyes: Conjunctivae are normal.  Neck: Normal range of motion.  Pulmonary/Chest: Effort normal.  Neurological: She is alert and oriented to person, place, and time. Gait normal.  Skin: Skin is warm and dry.  Psychiatric: Mood, memory, affect and judgment normal.  Vitals reviewed.   Assessment and Plan :  Need for prophylactic vaccination with combined diphtheria-tetanus-pertussis (DTP) vaccine - Plan: Td : Tetanus/diphtheria >7yo Preservative  free  Screening examination for measles - Plan: Measles/Mumps/Rubella Immunity  Pt needs to RTC in 1 month for her 3rd tetanus for documentation purposes for school. She need a copy of her MMR titer for school.   She will get it through [employer].  Windell Hummingbird PA-C  [facility] 08/10/2015 6:48 PM

## 2015-08-11 LAB — MEASLES/MUMPS/RUBELLA IMMUNITY
Mumps IgG: >300 AU/mL — ABNORMAL HIGH (ref <9.00)
Rubella: 3.17 Index — ABNORMAL HIGH (ref <0.90)

## 2016-03-04 ENCOUNTER — Encounter: Payer: Self-pay | Admitting: Gastroenterology

## 2016-05-28 ENCOUNTER — Ambulatory Visit (INDEPENDENT_AMBULATORY_CARE_PROVIDER_SITE_OTHER): Payer: 59 | Admitting: Physician Assistant

## 2016-05-28 DIAGNOSIS — Z23 Encounter for immunization: Secondary | ICD-10-CM

## 2016-05-28 NOTE — Progress Notes (Signed)
Needs TD to complete tetanus series.

## 2017-08-26 ENCOUNTER — Ambulatory Visit (INDEPENDENT_AMBULATORY_CARE_PROVIDER_SITE_OTHER): Payer: 59 | Admitting: Physician Assistant

## 2017-08-26 ENCOUNTER — Encounter: Payer: Self-pay | Admitting: Physician Assistant

## 2017-08-26 VITALS — BP 112/68 | HR 84 | Temp 98.7°F | Resp 16 | Ht 63.78 in | Wt 117.0 lb

## 2017-08-26 DIAGNOSIS — Z0000 Encounter for general adult medical examination without abnormal findings: Secondary | ICD-10-CM | POA: Diagnosis not present

## 2017-08-26 DIAGNOSIS — Z23 Encounter for immunization: Secondary | ICD-10-CM | POA: Diagnosis not present

## 2017-08-26 DIAGNOSIS — Z130 Encounter for screening for diseases of the blood and blood-forming organs and certain disorders involving the immune mechanism: Secondary | ICD-10-CM | POA: Diagnosis not present

## 2017-08-26 DIAGNOSIS — Z01419 Encounter for gynecological examination (general) (routine) without abnormal findings: Secondary | ICD-10-CM | POA: Diagnosis not present

## 2017-08-26 DIAGNOSIS — Z13228 Encounter for screening for other metabolic disorders: Secondary | ICD-10-CM | POA: Diagnosis not present

## 2017-08-26 DIAGNOSIS — Z13220 Encounter for screening for lipoid disorders: Secondary | ICD-10-CM | POA: Diagnosis not present

## 2017-08-26 DIAGNOSIS — Z114 Encounter for screening for human immunodeficiency virus [HIV]: Secondary | ICD-10-CM

## 2017-08-26 DIAGNOSIS — Z1329 Encounter for screening for other suspected endocrine disorder: Secondary | ICD-10-CM | POA: Diagnosis not present

## 2017-08-26 DIAGNOSIS — Z0184 Encounter for antibody response examination: Secondary | ICD-10-CM | POA: Diagnosis not present

## 2017-08-26 NOTE — Patient Instructions (Addendum)
IF you received an x-ray today, you will receive an invoice from [facility]. Please contact [facility] at [phone] with questions or concerns regarding your invoice.   IF you received labwork today, you will receive an invoice from [facility]. Please contact [facility] at [phone] with questions or concerns regarding your invoice.   Our billing staff will not be able to assist you with questions regarding bills from these companies.  You will be contacted with the lab results as soon as they are available. The fastest way to get your results is to activate your My Chart account. Instructions are located on the last page of this paperwork. If you have not heard from [place] regarding the results in 2 weeks, please contact this office.    I will contact you with your lab results as soon as they are available.   If you have not heard from me in 2 weeks, please contact me.  The fastest way to get your results is to register for My Chart (see the instructions on this printout).  Health Maintenance, Female Adopting a healthy lifestyle and getting preventive care can go a long way to promote health and wellness. Talk with your health care provider about what schedule of regular examinations is right for you. This is a good chance for you to check in with your provider about disease prevention and staying healthy. In between checkups, there are plenty of things you can do on your own. Experts have done a lot of research about which lifestyle changes and preventive measures are most likely to keep you healthy. Ask your health care provider for more information. Weight and diet Eat a healthy diet  Be sure to include plenty of vegetables, fruits, low-fat dairy products, and lean protein.  Do not eat a lot of foods high in solid fats, added sugars, or salt.  Get regular exercise. This is one of the most important things you can do for your health. ? Most adults should exercise  for at least 150 minutes each week. The exercise should increase your heart rate and make you sweat (moderate-intensity exercise). ? Most adults should also do strengthening exercises at least twice a week. This is in addition to the moderate-intensity exercise.  Maintain a healthy weight  Body mass index (BMI) is a measurement that can be used to identify possible weight problems. It estimates body fat based on height and weight. Your health care provider can help determine your BMI and help you achieve or maintain a healthy weight.  For females 68 years of age and older: ? A BMI below 18.5 is considered underweight. ? A BMI of 18.5 to 24.9 is normal. ? A BMI of 25 to 29.9 is considered overweight. ? A BMI of 30 and above is considered obese.  Watch levels of cholesterol and blood lipids  You should start having your blood tested for lipids and cholesterol at 47 years of age, then have this test every 5 years.  You may need to have your cholesterol levels checked more often if: ? Your lipid or cholesterol levels are high. ? You are older than 48 years of age. ? You are at high risk for heart disease.  Cancer screening Lung Cancer  Lung cancer screening is recommended for adults 10-64 years old who are at high risk for lung cancer because of a history of smoking.  A yearly low-dose CT scan of the lungs is recommended for people who: ? Currently smoke. ?  Have quit within the past 15 years. ? Have at least a 30-pack-year history of smoking. A pack year is smoking an average of one pack of cigarettes a day for 1 year.  Yearly screening should continue until it has been 15 years since you quit.  Yearly screening should stop if you develop a health problem that would prevent you from having lung cancer treatment.  Breast Cancer  Practice breast self-awareness. This means understanding how your breasts normally appear and feel.  It also means doing regular breast self-exams. Let  your health care provider know about any changes, no matter how small.  If you are in your 20s or 30s, you should have a clinical breast exam (CBE) by a health care provider every 1-3 years as part of a regular health exam.  If you are 42 or older, have a CBE every year. Also consider having a breast X-ray (mammogram) every year.  If you have a family history of breast cancer, talk to your health care provider about genetic screening.  If you are at high risk for breast cancer, talk to your health care provider about having an MRI and a mammogram every year.  Breast cancer gene (BRCA) assessment is recommended for women who have family members with BRCA-related cancers. BRCA-related cancers include: ? Breast. ? Ovarian. ? Tubal. ? Peritoneal cancers.  Results of the assessment will determine the need for genetic counseling and BRCA1 and BRCA2 testing.  Cervical Cancer Your health care provider may recommend that you be screened regularly for cancer of the pelvic organs (ovaries, uterus, and vagina). This screening involves a pelvic examination, including checking for microscopic changes to the surface of your cervix (Pap test). You may be encouraged to have this screening done every 3 years, beginning at age 25.  For women ages 59-65, health care providers may recommend pelvic exams and Pap testing every 3 years, or they may recommend the Pap and pelvic exam, combined with testing for human papilloma virus (HPV), every 5 years. Some types of HPV increase your risk of cervical cancer. Testing for HPV may also be done on women of any age with unclear Pap test results.  Other health care providers may not recommend any screening for nonpregnant women who are considered low risk for pelvic cancer and who do not have symptoms. Ask your health care provider if a screening pelvic exam is right for you.  If you have had past treatment for cervical cancer or a condition that could lead to cancer, you  need Pap tests and screening for cancer for at least 20 years after your treatment. If Pap tests have been discontinued, your risk factors (such as having a new sexual partner) need to be reassessed to determine if screening should resume. Some women have medical problems that increase the chance of getting cervical cancer. In these cases, your health care provider may recommend more frequent screening and Pap tests.  Colorectal Cancer  This type of cancer can be detected and often prevented.  Routine colorectal cancer screening usually begins at 47 years of age and continues through 47 years of age.  Your health care provider may recommend screening at an earlier age if you have risk factors for colon cancer.  Your health care provider may also recommend using home test kits to check for hidden blood in the stool.  A small camera at the end of a tube can be used to examine your colon directly (sigmoidoscopy or colonoscopy). This is done  to check for the earliest forms of colorectal cancer.  Routine screening usually begins at age 60.  Direct examination of the colon should be repeated every 5-10 years through 47 years of age. However, you may need to be screened more often if early forms of precancerous polyps or small growths are found.  Skin Cancer  Check your skin from head to toe regularly.  Tell your health care provider about any new moles or changes in moles, especially if there is a change in a mole's shape or color.  Also tell your health care provider if you have a mole that is larger than the size of a pencil eraser.  Always use sunscreen. Apply sunscreen liberally and repeatedly throughout the day.  Protect yourself by wearing long sleeves, pants, a wide-brimmed hat, and sunglasses whenever you are outside.  Heart disease, diabetes, and high blood pressure  High blood pressure causes heart disease and increases the risk of stroke. High blood pressure is more likely to  develop in: ? People who have blood pressure in the high end of the normal range (130-139/85-89 mm Hg). ? People who are overweight or obese. ? People who are African American.  If you are 45-73 years of age, have your blood pressure checked every 3-5 years. If you are 14 years of age or older, have your blood pressure checked every year. You should have your blood pressure measured twice-once when you are at a hospital or clinic, and once when you are not at a hospital or clinic. Record the average of the two measurements. To check your blood pressure when you are not at a hospital or clinic, you can use: ? An automated blood pressure machine at a pharmacy. ? A home blood pressure monitor.  If you are between 51 years and 50 years old, ask your health care provider if you should take aspirin to prevent strokes.  Have regular diabetes screenings. This involves taking a blood sample to check your fasting blood sugar level. ? If you are at a normal weight and have a low risk for diabetes, have this test once every three years after 47 years of age. ? If you are overweight and have a high risk for diabetes, consider being tested at a younger age or more often. Preventing infection Hepatitis B  If you have a higher risk for hepatitis B, you should be screened for this virus. You are considered at high risk for hepatitis B if: ? You were born in a country where hepatitis B is common. Ask your health care provider which countries are considered high risk. ? Your parents were born in a high-risk country, and you have not been immunized against hepatitis B (hepatitis B vaccine). ? You have HIV or AIDS. ? You use needles to inject street drugs. ? You live with someone who has hepatitis B. ? You have had sex with someone who has hepatitis B. ? You get hemodialysis treatment. ? You take certain medicines for conditions, including cancer, organ transplantation, and autoimmune conditions.  Hepatitis  C  Blood testing is recommended for: ? Everyone born from [birth year] through 1966. ? Anyone with known risk factors for hepatitis C.  Sexually transmitted infections (STIs)  You should be screened for sexually transmitted infections (STIs) including gonorrhea and chlamydia if: ? You are sexually active and are younger than 47 years of age. ? You are older than 47 years of age and your health care provider tells you that you are at  risk for this type of infection. ? Your sexual activity has changed since you were last screened and you are at an increased risk for chlamydia or gonorrhea. Ask your health care provider if you are at risk.  If you do not have HIV, but are at risk, it may be recommended that you take a prescription medicine daily to prevent HIV infection. This is called pre-exposure prophylaxis (PrEP). You are considered at risk if: ? You are sexually active and do not regularly use condoms or know the HIV status of your partner(s). ? You take drugs by injection. ? You are sexually active with a partner who has HIV.  Talk with your health care provider about whether you are at high risk of being infected with HIV. If you choose to begin PrEP, you should first be tested for HIV. You should then be tested every 3 months for as long as you are taking PrEP. Pregnancy  If you are premenopausal and you may become pregnant, ask your health care provider about preconception counseling.  If you may become pregnant, take 400 to 800 micrograms (mcg) of folic acid every day.  If you want to prevent pregnancy, talk to your health care provider about birth control (contraception). Osteoporosis and menopause  Osteoporosis is a disease in which the bones lose minerals and strength with aging. This can result in serious bone fractures. Your risk for osteoporosis can be identified using a bone density scan.  If you are 55 years of age or older, or if you are at risk for osteoporosis and fractures,  ask your health care provider if you should be screened.  Ask your health care provider whether you should take a calcium or vitamin D supplement to lower your risk for osteoporosis.  Menopause may have certain physical symptoms and risks.  Hormone replacement therapy may reduce some of these symptoms and risks. Talk to your health care provider about whether hormone replacement therapy is right for you. Follow these instructions at home:  Schedule regular health, dental, and eye exams.  Stay current with your immunizations.  Do not use any tobacco products including cigarettes, chewing tobacco, or electronic cigarettes.  If you are pregnant, do not drink alcohol.  If you are breastfeeding, limit how much and how often you drink alcohol.  Limit alcohol intake to no more than 1 drink per day for nonpregnant women. One drink equals 12 ounces of beer, 5 ounces of wine, or 1 ounces of hard liquor.  Do not use street drugs.  Do not share needles.  Ask your health care provider for help if you need support or information about quitting drugs.  Tell your health care provider if you often feel depressed.  Tell your health care provider if you have ever been abused or do not feel safe at home. This information is not intended to replace advice given to you by your health care provider. Make sure you discuss any questions you have with your health care provider. Document Released: 05/13/2011 Document Revised: 04/04/2016 Document Reviewed: 08/01/2015 Elsevier Interactive Patient Education  [employer].

## 2017-08-26 NOTE — Progress Notes (Signed)
Meghan Matthews  MRN: [id number] DOB: December 14, 1970  PCP: No primary care provider on file.  Subjective:  Pt presents to clinic for a CPE.  She also needs forms filled out for school.  She is well and has no concerns.  Last dental exam: 1-2x/year Last vision exam: last year Last pap: a few years ago - Last mammo: 02/15/2014   Vaccinations - UTD - needs copies of the vaccine titers and Hep B needs to be done. She needs TB screening for school - she has had a + TB test and has had a CXR in 2015 - she needs a copy of that within the last 10 year.  Typical meals for patient: 6 small meals a day Typical beverage choices: water Exercises: 4 times per week for 45 minutes Sleeps: 7-8 hrs per night and sleeping well   Patient Active Problem List   Diagnosis Date Noted  . Immune to varicella 02/12/2014  . Immune to measles 02/12/2014  . 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 EPIGASTRIC PAIN 03/06/2010  . ANEMIA, MILD, HX OF 03/06/2010    Review of Systems  Constitutional: Negative.   HENT: Negative.   Eyes: Negative.   Respiratory: Negative.   Cardiovascular: Negative.   Gastrointestinal: Negative.   Endocrine: Negative.   Genitourinary: Negative.   Musculoskeletal: Negative.   Skin: Negative.   Allergic/Immunologic: Negative.   Neurological: Negative.   Hematological: Negative.   Psychiatric/Behavioral: Negative.      No current outpatient prescriptions on file prior to visit.   No current facility-administered medications on file prior to visit.     Allergies  Allergen Reactions  . Peanut-Containing Drug Products     Social History   Social History  . Marital status: Married    Spouse name: N/A  . Number of children: N/A  . Years of education: N/A   Social History Main Topics  . Smoking status: Never Smoker  . Smokeless tobacco: Never Used  . Alcohol use No  . Drug use: No  . Sexual activity: Yes    Partners: Male    Birth control/  protection: Surgical   Other Topics Concern  . None   Social History Narrative   From [place] in 2011.   Planning on attending [employer] - Spring 2019 - a year program   Married   3 children -           History reviewed. No pertinent surgical history.  Family History  Problem Relation Age of Onset  . Hypertension Mother   . Diabetes Father      Objective:  BP 112/68   Pulse 84   Temp 98.7 F (37.1 C) (Oral)   Resp 16   Ht 5' 3.78" (1.62 m)   Wt 117 lb (53.1 kg)   LMP 08/17/2017   SpO2 97%   BMI 20.22 kg/m   Physical Exam  Constitutional: She is oriented to person, place, and time and well-developed, well-nourished, and in no distress.  HENT:  Head: Normocephalic and atraumatic.  Right Ear: Hearing, tympanic membrane, external ear and ear canal normal.  Left Ear: Hearing, tympanic membrane, external ear and ear canal normal.  Nose: Nose normal.  Mouth/Throat: Uvula is midline, oropharynx is clear and moist and mucous membranes are normal.  Eyes: Pupils are equal, round, and reactive to light. Conjunctivae and EOM are normal.  Neck: Trachea normal and normal range of motion. Neck supple. No thyroid mass and  no thyromegaly present.  Cardiovascular: Normal rate, regular rhythm and normal heart sounds.   No murmur heard. Pulmonary/Chest: Effort normal and breath sounds normal. She has no wheezes. Right breast exhibits no inverted nipple, no mass, no nipple discharge, no skin change and no tenderness. Left breast exhibits no inverted nipple, no mass, no nipple discharge, no skin change and no tenderness. Breasts are symmetrical.  Fibrous breasts bilaterally  Abdominal: Soft. Bowel sounds are normal. There is no tenderness.  Genitourinary: Vagina normal, cervix normal, right adnexa normal, left adnexa normal and vulva normal. No vaginal discharge found.  Musculoskeletal: Normal range of motion.  Lymphadenopathy:    She has no cervical  adenopathy.  Neurological: She is alert and oriented to person, place, and time. She has normal motor skills, normal sensation, normal strength and normal reflexes. Gait normal.  Skin: Skin is warm and dry.  Tattoos present  Psychiatric: Mood, memory, affect and judgment normal.    Wt Readings from Last 3 Encounters:  08/26/17 117 lb (53.1 kg)  08/10/15 119 lb (54 kg)  02/12/14 110 lb 9.6 oz (50.2 kg)     Visual Acuity Screening   Right eye Left eye Both eyes  Without correction: 20/13-1 20/13-1 20/13  With correction:       Assessment and Plan :  Annual physical exam  Need for influenza vaccination - Plan: Flu Vaccine QUAD 36+ mos IM  Encounter for gynecological examination without abnormal finding - Plan: Pap IG and HPV (high risk) DNA detection  Screening for deficiency anemia - Plan: CBC with Differential/Platelet  Screening for metabolic disorder - Plan: CMP14+EGFR  Screening for HIV (human immunodeficiency virus) - Plan: HIV antibody  Screening, lipid - Plan: Lipid panel  Screening for thyroid disorder - Plan: TSH  Immunity status testing - Plan: Hepatitis B surface antibody   Check labs for health status as well as for immune status for school.  Forms were filled out and anticipatory guidance was given to patient.  Windell Hummingbird PA-C  [facility] 08/29/2017 3:52 PM

## 2017-08-27 LAB — CMP14+EGFR
ALK PHOS: 68 IU/L (ref 39–117)
ALT: 15 IU/L (ref 0–32)
AST: 28 IU/L (ref 0–40)
Albumin/Globulin Ratio: 1.7 (ref 1.2–2.2)
Albumin: 5 g/dL (ref 3.5–5.5)
BILIRUBIN TOTAL: 0.5 mg/dL (ref 0.0–1.2)
BUN/Creatinine Ratio: 15 (ref 9–23)
BUN: 10 mg/dL (ref 6–24)
CO2: 24 mmol/L (ref 20–29)
CREATININE: 0.66 mg/dL (ref 0.57–1.00)
Calcium: 9.7 mg/dL (ref 8.7–10.2)
Chloride: 98 mmol/L (ref 96–106)
GFR calc Af Amer: 122 mL/min/{1.73_m2} (ref >59)
GFR, EST NON AFRICAN AMERICAN: 106 mL/min/{1.73_m2} (ref >59)
GLUCOSE: 82 mg/dL (ref 65–99)
Globulin, Total: 3 g/dL (ref 1.5–4.5)
Potassium: 4.1 mmol/L (ref 3.5–5.2)
SODIUM: 138 mmol/L (ref 134–144)
Total Protein: 8 g/dL (ref 6.0–8.5)

## 2017-08-27 LAB — CBC WITH DIFFERENTIAL/PLATELET
BASOS ABS: 0.1 10*3/uL (ref 0.0–0.2)
Basos: 1 %
EOS (ABSOLUTE): 0.1 10*3/uL (ref 0.0–0.4)
Eos: 2 %
Hematocrit: 41.6 % (ref 34.0–46.6)
Hemoglobin: 13.6 g/dL (ref 11.1–15.9)
IMMATURE GRANS (ABS): 0 10*3/uL (ref 0.0–0.1)
Immature Granulocytes: 0 %
LYMPHS: 27 %
Lymphocytes Absolute: 1.4 10*3/uL (ref 0.7–3.1)
MCH: 29.4 pg (ref 26.6–33.0)
MCHC: 32.7 g/dL (ref 31.5–35.7)
MCV: 90 fL (ref 79–97)
Monocytes Absolute: 0.5 10*3/uL (ref 0.1–0.9)
Monocytes: 9 %
NEUTROS ABS: 3.1 10*3/uL (ref 1.4–7.0)
Neutrophils: 61 %
PLATELETS: 301 10*3/uL (ref 150–379)
RBC: 4.62 x10E6/uL (ref 3.77–5.28)
RDW: 13.1 % (ref 12.3–15.4)
WBC: 5.1 10*3/uL (ref 3.4–10.8)

## 2017-08-27 LAB — LIPID PANEL
CHOLESTEROL TOTAL: 169 mg/dL (ref 100–199)
Chol/HDL Ratio: 2.5 ratio (ref 0.0–4.4)
HDL: 67 mg/dL (ref >39)
LDL CALC: 86 mg/dL (ref 0–99)
Triglycerides: 78 mg/dL (ref 0–149)
VLDL CHOLESTEROL CAL: 16 mg/dL (ref 5–40)

## 2017-08-27 LAB — HIV ANTIBODY (ROUTINE TESTING W REFLEX): HIV SCREEN 4TH GENERATION: NONREACTIVE

## 2017-08-27 LAB — TSH: TSH: 2.27 u[IU]/mL (ref 0.450–4.500)

## 2017-08-27 LAB — HEPATITIS B SURFACE ANTIBODY, QUANTITATIVE: Hepatitis B Surf Ab Quant: >1000 m[IU]/mL (ref >9.9)

## 2017-08-28 LAB — PAP IG AND HPV HIGH-RISK
HPV, HIGH-RISK: NEGATIVE
PAP SMEAR COMMENT: 0

## 2017-09-11 ENCOUNTER — Encounter: Payer: Self-pay | Admitting: Physician Assistant

## 2017-11-15 DIAGNOSIS — Z0100 Encounter for examination of eyes and vision without abnormal findings: Secondary | ICD-10-CM | POA: Diagnosis not present

## 2018-06-22 DIAGNOSIS — Z23 Encounter for immunization: Secondary | ICD-10-CM | POA: Diagnosis not present

## 2018-09-12 ENCOUNTER — Ambulatory Visit: Payer: 59 | Admitting: Family Medicine
